# Patient Record
Sex: Female | Born: 1955 | Race: White | Hispanic: No | Marital: Single | Smoking: Current every day smoker
Health system: Southern US, Community
[De-identification: ages and names within clinical notes are randomized; demographics above are authoritative.]

## PROBLEM LIST (undated history)

## (undated) DIAGNOSIS — R569 Unspecified convulsions: Secondary | ICD-10-CM

## (undated) DIAGNOSIS — J449 Chronic obstructive pulmonary disease, unspecified: Secondary | ICD-10-CM

## (undated) DIAGNOSIS — F419 Anxiety disorder, unspecified: Secondary | ICD-10-CM

## (undated) DIAGNOSIS — F4310 Post-traumatic stress disorder, unspecified: Secondary | ICD-10-CM

## (undated) DIAGNOSIS — E876 Hypokalemia: Secondary | ICD-10-CM

## (undated) HISTORY — PX: NECK SURGERY: SHX720

## (undated) HISTORY — PX: BACK SURGERY: SHX140

---

## 2002-10-19 ENCOUNTER — Ambulatory Visit (HOSPITAL_COMMUNITY): Admission: RE | Admit: 2002-10-19 | Discharge: 2002-10-19 | Payer: Self-pay | Admitting: Neurosurgery

## 2002-10-19 ENCOUNTER — Encounter: Payer: Self-pay | Admitting: Neurosurgery

## 2002-11-16 ENCOUNTER — Ambulatory Visit (HOSPITAL_COMMUNITY): Admission: RE | Admit: 2002-11-16 | Discharge: 2002-11-17 | Payer: Self-pay | Admitting: Neurosurgery

## 2003-01-04 ENCOUNTER — Encounter: Payer: Self-pay | Admitting: Neurosurgery

## 2003-01-04 ENCOUNTER — Inpatient Hospital Stay (HOSPITAL_COMMUNITY): Admission: AD | Admit: 2003-01-04 | Discharge: 2003-01-08 | Payer: Self-pay | Admitting: Neurosurgery

## 2006-01-30 ENCOUNTER — Encounter: Admission: RE | Admit: 2006-01-30 | Discharge: 2006-01-30 | Payer: Self-pay | Admitting: Family Medicine

## 2015-11-13 ENCOUNTER — Inpatient Hospital Stay (HOSPITAL_COMMUNITY)
Admission: EM | Admit: 2015-11-13 | Discharge: 2015-11-18 | DRG: 470 | Disposition: A | Discharge status: Home Health | Payer: Medicare Other | Attending: Orthopedic Surgery | Admitting: Orthopedic Surgery

## 2015-11-13 ENCOUNTER — Inpatient Hospital Stay (HOSPITAL_COMMUNITY): Payer: Medicare Other

## 2015-11-13 ENCOUNTER — Emergency Department (HOSPITAL_COMMUNITY): Payer: Medicare Other

## 2015-11-13 ENCOUNTER — Encounter (HOSPITAL_COMMUNITY): Payer: Self-pay | Admitting: Emergency Medicine

## 2015-11-13 DIAGNOSIS — G629 Polyneuropathy, unspecified: Secondary | ICD-10-CM | POA: Diagnosis not present

## 2015-11-13 DIAGNOSIS — S72011A Unspecified intracapsular fracture of right femur, initial encounter for closed fracture: Principal | ICD-10-CM | POA: Diagnosis present

## 2015-11-13 DIAGNOSIS — Z79899 Other long term (current) drug therapy: Secondary | ICD-10-CM | POA: Diagnosis not present

## 2015-11-13 DIAGNOSIS — Z01818 Encounter for other preprocedural examination: Secondary | ICD-10-CM

## 2015-11-13 DIAGNOSIS — Z09 Encounter for follow-up examination after completed treatment for conditions other than malignant neoplasm: Secondary | ICD-10-CM

## 2015-11-13 DIAGNOSIS — F329 Major depressive disorder, single episode, unspecified: Secondary | ICD-10-CM | POA: Diagnosis not present

## 2015-11-13 DIAGNOSIS — G4700 Insomnia, unspecified: Secondary | ICD-10-CM | POA: Diagnosis present

## 2015-11-13 DIAGNOSIS — R569 Unspecified convulsions: Secondary | ICD-10-CM

## 2015-11-13 DIAGNOSIS — M545 Low back pain: Secondary | ICD-10-CM | POA: Diagnosis not present

## 2015-11-13 DIAGNOSIS — W1830XA Fall on same level, unspecified, initial encounter: Secondary | ICD-10-CM | POA: Diagnosis present

## 2015-11-13 DIAGNOSIS — E871 Hypo-osmolality and hyponatremia: Secondary | ICD-10-CM | POA: Diagnosis present

## 2015-11-13 DIAGNOSIS — D62 Acute posthemorrhagic anemia: Secondary | ICD-10-CM | POA: Diagnosis not present

## 2015-11-13 DIAGNOSIS — Z79891 Long term (current) use of opiate analgesic: Secondary | ICD-10-CM

## 2015-11-13 DIAGNOSIS — G8929 Other chronic pain: Secondary | ICD-10-CM | POA: Diagnosis present

## 2015-11-13 DIAGNOSIS — F17210 Nicotine dependence, cigarettes, uncomplicated: Secondary | ICD-10-CM | POA: Diagnosis not present

## 2015-11-13 DIAGNOSIS — F419 Anxiety disorder, unspecified: Secondary | ICD-10-CM | POA: Diagnosis not present

## 2015-11-13 DIAGNOSIS — W19XXXA Unspecified fall, initial encounter: Secondary | ICD-10-CM

## 2015-11-13 DIAGNOSIS — F4310 Post-traumatic stress disorder, unspecified: Secondary | ICD-10-CM | POA: Diagnosis present

## 2015-11-13 DIAGNOSIS — E876 Hypokalemia: Secondary | ICD-10-CM | POA: Diagnosis not present

## 2015-11-13 DIAGNOSIS — S72001A Fracture of unspecified part of neck of right femur, initial encounter for closed fracture: Secondary | ICD-10-CM | POA: Diagnosis not present

## 2015-11-13 DIAGNOSIS — F17200 Nicotine dependence, unspecified, uncomplicated: Secondary | ICD-10-CM

## 2015-11-13 DIAGNOSIS — Z419 Encounter for procedure for purposes other than remedying health state, unspecified: Secondary | ICD-10-CM

## 2015-11-13 DIAGNOSIS — M25551 Pain in right hip: Secondary | ICD-10-CM | POA: Diagnosis present

## 2015-11-13 HISTORY — DX: Post-traumatic stress disorder, unspecified: F43.10

## 2015-11-13 HISTORY — DX: Unspecified convulsions: R56.9

## 2015-11-13 HISTORY — DX: Anxiety disorder, unspecified: F41.9

## 2015-11-13 LAB — BASIC METABOLIC PANEL
ANION GAP: 8 (ref 5–15)
BUN: 8 mg/dL (ref 6–20)
CHLORIDE: 96 mmol/L — AB (ref 101–111)
CO2: 26 mmol/L (ref 22–32)
Calcium: 8.7 mg/dL — ABNORMAL LOW (ref 8.9–10.3)
Creatinine, Ser: 0.66 mg/dL (ref 0.44–1.00)
GFR calc Af Amer: >60 mL/min (ref >60)
GLUCOSE: 113 mg/dL — AB (ref 65–99)
POTASSIUM: 3.4 mmol/L — AB (ref 3.5–5.1)
Sodium: 130 mmol/L — ABNORMAL LOW (ref 135–145)

## 2015-11-13 LAB — PHOSPHORUS: Phosphorus: 3.4 mg/dL (ref 2.5–4.6)

## 2015-11-13 LAB — CBC WITH DIFFERENTIAL/PLATELET
BASOS ABS: 0 10*3/uL (ref 0.0–0.1)
Basophils Relative: 0 %
Eosinophils Absolute: 0 10*3/uL (ref 0.0–0.7)
Eosinophils Relative: 0 %
HCT: 35.2 % — ABNORMAL LOW (ref 36.0–46.0)
HEMOGLOBIN: 12.1 g/dL (ref 12.0–15.0)
LYMPHS ABS: 0.8 10*3/uL (ref 0.7–4.0)
LYMPHS PCT: 13 %
MCH: 32 pg (ref 26.0–34.0)
MCHC: 34.4 g/dL (ref 30.0–36.0)
MCV: 93.1 fL (ref 78.0–100.0)
Monocytes Absolute: 0.7 10*3/uL (ref 0.1–1.0)
Monocytes Relative: 11 %
NEUTROS ABS: 4.6 10*3/uL (ref 1.7–7.7)
NEUTROS PCT: 76 %
PLATELETS: 272 10*3/uL (ref 150–400)
RBC: 3.78 MIL/uL — AB (ref 3.87–5.11)
RDW: 13 % (ref 11.5–15.5)
WBC: 6 10*3/uL (ref 4.0–10.5)

## 2015-11-13 LAB — PROTIME-INR
INR: 1.03
PROTHROMBIN TIME: 13.5 s (ref 11.4–15.2)

## 2015-11-13 LAB — APTT: APTT: 31 s (ref 24–36)

## 2015-11-13 LAB — MRSA PCR SCREENING: MRSA by PCR: NEGATIVE

## 2015-11-13 LAB — MAGNESIUM: MAGNESIUM: 2.1 mg/dL (ref 1.7–2.4)

## 2015-11-13 LAB — GLUCOSE, CAPILLARY
GLUCOSE-CAPILLARY: 104 mg/dL — AB (ref 65–99)
GLUCOSE-CAPILLARY: 104 mg/dL — AB (ref 65–99)

## 2015-11-13 MED ORDER — CEFAZOLIN SODIUM-DEXTROSE 2-4 GM/100ML-% IV SOLN: 2.0000 g | INTRAVENOUS | Status: DC

## 2015-11-13 MED ORDER — GABAPENTIN 300 MG PO CAPS
300.0000 mg | ORAL_CAPSULE | Freq: Three times a day (TID) | ORAL | Status: DC
  Administered 2015-11-13 – 2015-11-18 (×13): 300 mg via ORAL
  Filled 2015-11-13 (×13): qty 1

## 2015-11-13 MED ORDER — DEXTROSE 5 % IV SOLN
3.0000 g | INTRAVENOUS | Status: DC
  Filled 2015-11-13: qty 3000

## 2015-11-13 MED ORDER — SODIUM CHLORIDE 0.9 % IV SOLN
INTRAVENOUS | Status: DC
  Administered 2015-11-14: 06:00:00 via INTRAVENOUS

## 2015-11-13 MED ORDER — METHOCARBAMOL 1000 MG/10ML IJ SOLN
1000.0000 mg | Freq: Four times a day (QID) | INTRAVENOUS | Status: DC | PRN
  Filled 2015-11-13: qty 10

## 2015-11-13 MED ORDER — CEFAZOLIN SODIUM 10 G IJ SOLR
3.0000 g | INTRAMUSCULAR | Status: DC
  Filled 2015-11-13: qty 3000

## 2015-11-13 MED ORDER — ONDANSETRON HCL 4 MG PO TABS: 4.0000 mg | ORAL_TABLET | Freq: Four times a day (QID) | ORAL | Status: DC | PRN

## 2015-11-13 MED ORDER — TRANEXAMIC ACID 1000 MG/10ML IV SOLN
1000.0000 mg | INTRAVENOUS | Status: AC
  Filled 2015-11-13: qty 10

## 2015-11-13 MED ORDER — SODIUM CHLORIDE 0.9 % IV SOLN
INTRAVENOUS | Status: DC
  Administered 2015-11-13: 18:00:00 via INTRAVENOUS

## 2015-11-13 MED ORDER — ACETAMINOPHEN 500 MG PO TABS
1000.0000 mg | ORAL_TABLET | Freq: Four times a day (QID) | ORAL | Status: DC
  Administered 2015-11-14 (×4): 1000 mg via ORAL
  Filled 2015-11-13 (×4): qty 2

## 2015-11-13 MED ORDER — MORPHINE SULFATE (PF) 2 MG/ML IV SOLN
2.0000 mg | INTRAVENOUS | Status: DC | PRN
  Administered 2015-11-13: 2 mg via INTRAVENOUS
  Filled 2015-11-13: qty 1

## 2015-11-13 MED ORDER — QUETIAPINE FUMARATE 100 MG PO TABS
100.0000 mg | ORAL_TABLET | Freq: Two times a day (BID) | ORAL | Status: DC
  Administered 2015-11-13 – 2015-11-18 (×9): 100 mg via ORAL
  Filled 2015-11-13 (×11): qty 1

## 2015-11-13 MED ORDER — ACETAMINOPHEN 10 MG/ML IV SOLN
1000.0000 mg | INTRAVENOUS | Status: AC
Start: 2015-11-13 — End: 2015-11-13
  Administered 2015-11-13: 1000 mg via INTRAVENOUS
  Filled 2015-11-13: qty 100

## 2015-11-13 MED ORDER — QUETIAPINE FUMARATE 300 MG PO TABS
300.0000 mg | ORAL_TABLET | Freq: Two times a day (BID) | ORAL | Status: DC
  Administered 2015-11-13 – 2015-11-18 (×10): 300 mg via ORAL
  Filled 2015-11-13 (×11): qty 1

## 2015-11-13 MED ORDER — CHLORHEXIDINE GLUCONATE 4 % EX LIQD
60.0000 mL | Freq: Once | CUTANEOUS | Status: AC
  Administered 2015-11-13: 4 via TOPICAL
  Filled 2015-11-13: qty 15

## 2015-11-13 MED ORDER — POVIDONE-IODINE 10 % EX SWAB: 2.0000 "application " | Freq: Once | CUTANEOUS | Status: DC

## 2015-11-13 MED ORDER — CHLORHEXIDINE GLUCONATE 4 % EX LIQD
60.0000 mL | Freq: Once | CUTANEOUS | Status: AC
  Administered 2015-11-13: 4 via TOPICAL

## 2015-11-13 MED ORDER — ALPRAZOLAM 0.5 MG PO TABS
1.0000 mg | ORAL_TABLET | Freq: Three times a day (TID) | ORAL | Status: DC
  Administered 2015-11-13 – 2015-11-18 (×13): 1 mg via ORAL
  Filled 2015-11-13 (×13): qty 2

## 2015-11-13 MED ORDER — AMPHETAMINE-DEXTROAMPHETAMINE 10 MG PO TABS
20.0000 mg | ORAL_TABLET | Freq: Every day | ORAL | Status: DC
  Administered 2015-11-14 – 2015-11-18 (×3): 20 mg via ORAL
  Filled 2015-11-13 (×4): qty 2

## 2015-11-13 MED ORDER — MORPHINE SULFATE (PF) 4 MG/ML IV SOLN
4.0000 mg | Freq: Once | INTRAVENOUS | Status: AC
Start: 2015-11-13 — End: 2015-11-13
  Administered 2015-11-13: 4 mg via INTRAVENOUS
  Filled 2015-11-13: qty 1

## 2015-11-13 MED ORDER — FENTANYL CITRATE (PF) 100 MCG/2ML IJ SOLN: 25.0000 ug | INTRAMUSCULAR | Status: DC | PRN

## 2015-11-13 MED ORDER — ONDANSETRON HCL 4 MG/2ML IJ SOLN: 4.0000 mg | Freq: Four times a day (QID) | INTRAMUSCULAR | Status: DC | PRN

## 2015-11-13 MED ORDER — OXYCODONE HCL 5 MG PO TABS
5.0000 mg | ORAL_TABLET | ORAL | Status: DC | PRN
  Administered 2015-11-14 (×4): 10 mg via ORAL
  Filled 2015-11-13 (×4): qty 2

## 2015-11-13 MED ORDER — TRAZODONE HCL 100 MG PO TABS
300.0000 mg | ORAL_TABLET | Freq: Every day | ORAL | Status: DC
  Administered 2015-11-13 – 2015-11-17 (×5): 300 mg via ORAL
  Filled 2015-11-13 (×5): qty 3

## 2015-11-13 MED ORDER — SODIUM CHLORIDE 0.9 % IV SOLN
1000.0000 mg | INTRAVENOUS | Status: AC
  Administered 2015-11-15: 1000 mg via INTRAVENOUS
  Filled 2015-11-13 (×2): qty 10

## 2015-11-13 MED ORDER — TIZANIDINE HCL 4 MG PO TABS: 4.0000 mg | ORAL_TABLET | Freq: Four times a day (QID) | ORAL | Status: DC | PRN

## 2015-11-13 MED ORDER — DEXTROSE 5 % IV SOLN: 3.0000 g | INTRAVENOUS | Status: DC

## 2015-11-13 MED ORDER — FENTANYL CITRATE (PF) 100 MCG/2ML IJ SOLN
50.0000 ug | Freq: Once | INTRAMUSCULAR | Status: AC
  Administered 2015-11-13: 50 ug via INTRAVENOUS
  Filled 2015-11-13: qty 2

## 2015-11-13 MED ORDER — LORAZEPAM 2 MG/ML IJ SOLN
1.0000 mg | Freq: Once | INTRAMUSCULAR | Status: AC | PRN
  Administered 2015-11-13: 1 mg via INTRAVENOUS
  Filled 2015-11-13: qty 1

## 2015-11-13 MED ORDER — NICOTINE 14 MG/24HR TD PT24
14.0000 mg | MEDICATED_PATCH | Freq: Every day | TRANSDERMAL | Status: DC
  Administered 2015-11-14: 14 mg via TRANSDERMAL
  Filled 2015-11-13: qty 1

## 2015-11-13 NOTE — ED Notes (Signed)
Pt to xray

## 2015-11-13 NOTE — H&P (Signed)
History and Physical    ARIES KASA LKG:[id number] DOB: January 30, 1955 DOA: 11/13/2015  PCP: No primary care provider on file. Patient coming from: home  Chief Complaint: R hip pain  HPI: MARCHA LICKLIDER is a 60 y.o. female with medical history significant of anxiety, PTSD, Szrs presenting w/ 3 day history of right hip pain. Fall sustained after left knee gave out. 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 Patient's right hip struck the floor. Immediately painful. Denies any head trauma, LOC, neck stiffness, dizziness, vertigo, chest pain, palpitation, shortness of breath, lower extremity swelling. Pain is constant and worse with certain movements. Induration is extremely difficult as weightbearing is very painful. Hydrocodone with minimal relief.   ED Course: Objective findings outlined below. Or the consult and for surgical correction.  Review of Systems: As per HPI otherwise 10 point review of systems negative.   Ambulatory Status: Prior to event no restrictions  Past Medical History:  Diagnosis Date  . Anxiety   . PTSD (post-traumatic stress disorder)   . Seizure in infant [facility])    suspect febrile seizure. Pt unsure of history.     Past Surgical History:  Procedure Laterality Date  . BACK SURGERY    . CESAREAN SECTION    . NECK SURGERY      Social History   Social History  . Marital status: Single    Spouse name: N/A  . Number of children: N/A  . Years of education: N/A   Occupational History  . Not on file.   Social History Main Topics  . Smoking status: Current Every Day Smoker    Packs/day: 1.00    Types: Cigarettes  . Smokeless tobacco: Never Used  . Alcohol use No  . Drug use: No  . Sexual activity: Not on file   Other Topics Concern  . Not on file   Social History Narrative  . No narrative on file    Allergies  Allergen Reactions  . Nsaids     "stomach problems"    History reviewed. No pertinent family  history.  Prior to Admission medications   Medication Sig Start Date End Date Taking? Authorizing Provider  albuterol (PROVENTIL HFA;VENTOLIN HFA) 108 (90 Base) MCG/ACT inhaler Inhale 1-2 puffs into the lungs every 6 (six) hours as needed for wheezing or shortness of breath.   Yes Historical Provider, MD  ALPRAZolam Duanne Moron) 1 MG tablet Take 1 mg by mouth 3 (three) times daily.   Yes Historical Provider, MD  amphetamine-dextroamphetamine (ADDERALL) 20 MG tablet Take 20 mg by mouth daily.   Yes Historical Provider, MD  gabapentin (NEURONTIN) 300 MG capsule Take 300 mg by mouth 3 (three) times daily.   Yes Historical Provider, MD  HYDROcodone-acetaminophen (NORCO) 10-325 MG tablet Take 1-2 tablets by mouth every 6 (six) hours as needed for moderate pain.   Yes Historical Provider, MD  QUEtiapine (SEROQUEL) 100 MG tablet Take 100 mg by mouth 2 (two) times daily.   Yes Historical Provider, MD  QUEtiapine (SEROQUEL) 300 MG tablet Take 300 mg by mouth 2 (two) times daily.   Yes Historical Provider, MD  tiZANidine (ZANAFLEX) 4 MG tablet Take 4 mg by mouth every 6 (six) hours as needed for muscle spasms.   Yes Historical Provider, MD  traZODone (DESYREL) 150 MG tablet Take 300 mg by mouth at bedtime.   Yes Historical Provider, MD    Physical Exam: Vitals:   11/13/15 1445 11/13/15 1515 11/13/15 1530 11/13/15 1600  BP: 102/74 124/86 119/82 115/82  Pulse: 81 73 70 74  Resp: 18  18   Temp:      TempSrc:      SpO2: 96% 95% 96% 96%     General:  Appears calm and comfortable Eyes:  PERRL, EOMI, normal lids, iris ENT:  grossly normal hearing, lips & tongue, mmm Neck:  no LAD, masses or thyromegaly Cardiovascular:  RRR, II/VI systolic murmur. No LE edema.  Respiratory:  CTA bilaterally, no w/r/r. Normal respiratory effort. Abdomen:  soft, ntnd, NABS Skin:  no rash or induration seen on limited exam Musculoskeletal:  grossly normal tone BUE/BLE, good ROM, no bony abnormality Psychiatric:  grossly  normal mood and affect, speech fluent and appropriate, AOx3 Neurologic:  CN 2-12 grossly intact, moves all extremities in coordinated fashion, sensation intact  Labs on Admission: I have personally reviewed following labs and imaging studies  CBC:  Recent Labs Lab 11/13/15 1336  WBC 6.0  NEUTROABS 4.6  HGB 12.1  HCT 35.2*  MCV 93.1  PLT 329   Basic Metabolic Panel:  Recent Labs Lab 11/13/15 1336  NA 130*  K 3.4*  CL 96*  CO2 26  GLUCOSE 113*  BUN 8  CREATININE 0.66  CALCIUM 8.7*   GFR: CrCl cannot be calculated (Unknown ideal weight.). Liver Function Tests: No results for input(s): AST, ALT, ALKPHOS, BILITOT, PROT, ALBUMIN in the last 168 hours. No results for input(s): LIPASE, AMYLASE in the last 168 hours. No results for input(s): AMMONIA in the last 168 hours. Coagulation Profile: No results for input(s): INR, PROTIME in the last 168 hours. Cardiac Enzymes: No results for input(s): CKTOTAL, CKMB, CKMBINDEX, TROPONINI in the last 168 hours. BNP (last 3 results) No results for input(s): PROBNP in the last 8760 hours. HbA1C: No results for input(s): HGBA1C in the last 72 hours. CBG: No results for input(s): GLUCAP in the last 168 hours. Lipid Profile: No results for input(s): CHOL, HDL, LDLCALC, TRIG, CHOLHDL, LDLDIRECT in the last 72 hours. Thyroid Function Tests: No results for input(s): TSH, T4TOTAL, FREET4, T3FREE, THYROIDAB in the last 72 hours. Anemia Panel: No results for input(s): VITAMINB12, FOLATE, FERRITIN, TIBC, IRON, RETICCTPCT in the last 72 hours. Urine analysis: No results found for: COLORURINE, APPEARANCEUR, LABSPEC, PHURINE, GLUCOSEU, HGBUR, BILIRUBINUR, KETONESUR, PROTEINUR, UROBILINOGEN, NITRITE, LEUKOCYTESUR  Creatinine Clearance: CrCl cannot be calculated (Unknown ideal weight.).  Sepsis Labs: '@LABRCNTIP'$ (procalcitonin:4,lacticidven:4) )No results found for this or any previous visit (from the past 240 hour(s)).   Radiological Exams  on Admission: Dg Hip Unilat  With Pelvis 2-3 Views Right  Result Date: 11/13/2015 CLINICAL DATA:  Status post fall 11/09/2012 with onset of right hip pain. Initial encounter. EXAM: DG HIP (WITH OR WITHOUT PELVIS) 2-3V RIGHT COMPARISON:  None. FINDINGS: There is a subcapital right hip fracture. The fracture is age indeterminate. There appears to be osteolysis of the inferior portion of the femoral head and proximal femoral neck. No dislocation. No other bony or joint abnormality. IMPRESSION: Subcapital right hip fracture cannot be definitively characterized but appears subacute to remote. Electronically Signed   By: Inge Rise M.D.   On: 11/13/2015 13:26   Dg Femur Min 2 Views Right  Result Date: 11/13/2015 CLINICAL DATA:  Right upper leg pain since a fall 11/10/2015. Initial encounter. EXAM: RIGHT FEMUR 2 VIEWS COMPARISON:  Plain films right hip this same day. FINDINGS: Right femoral neck fracture is identified. No other bony or joint abnormality is seen. IMPRESSION: Right femoral neck fracture as seen on the  comparison examination. The study is otherwise negative. Electronically Signed   By: Inge Rise M.D.   On: 11/13/2015 13:27    EKG: pending  Assessment/Plan Active Problems:   Closed right hip fracture (HCC)   Seizures (HCC)   PTSD (post-traumatic stress disorder)   Anxiety   Tobacco use disorder   Closed right hip fracture: As noted on radiology report as above. Neurovascularly intact. Orthopedic surgery, Dr. Stann Mainland, consulted and planning to evaluate patient for possible surgical correction versus conservative management. - Further management per order to - We'll continue to have patient nothing by mouth until evaluated - Preop EKG, chest x-ray, coags ordered  Hyponatremia: 130. Mild - NS IV - BMET in am  Insomnia/Depression/PTSD/Anxiety: takes xanax '1mg'$  TID - IV ativan until able to take PO - Continue Seroquel, Trazodone  Peripheral neuropathy and chronic MSK  pain: - Continue neurontin when taking PO - On Norco at baseline. - Continue zanaflex when able.  Tobacco: - nicotine patch  Infant Seizure: likely febrile seizure as small child. Not on any controller medications.  - Monitor  DVT prophylaxis: SCD  Code Status: full  Family Communication: none  Disposition Plan: pending ortho eval and   Consults called: ortho  Admission status: inpt    MERRELL, DAVID J MD [facility]  If 7PM-7AM, please contact night-coverage [email or web address] Password [email or web address]  11/13/2015, 5:42 PM

## 2015-11-13 NOTE — ED Triage Notes (Signed)
Pt fell on Sunday and has been having pain in her right hip/thigh since. Pt unable to bear weight on right leg. Pt's right foot externally rotated. Pt's family concerned for hip fx

## 2015-11-13 NOTE — ED Notes (Signed)
Pt brought back to room with daughter. Pt not able to put any weight on rt side. Pt complains of 10/10 pain in her rt leg after a fall 3 days ago. Pt placed on monitor and given warm blanket.

## 2015-11-13 NOTE — ED Notes (Signed)
Pt returned to room  

## 2015-11-13 NOTE — ED Notes (Signed)
Pt placed on fracture pan. Advised pt to use the call bell when finished.

## 2015-11-13 NOTE — ED Provider Notes (Signed)
[facility] DEPT Provider Note   CSN: [id number] Arrival date & time: 11/13/15  1140     History   Chief Complaint Chief Complaint  Patient presents with  . Hip Pain  . Fall    HPI Jennifer Stevens is a 60 y.o. female.  HPI   Patient is a 60 year old female with history of seizures, PTSD and anxiety who presents the ED with complaint of right hip pain, onset 3 days. Patient reports having chronic left knee pain for the past 6 months which she notes all intermittently causes her left knee give out on her. She reports while she was walking 3 nights ago her left knee gave out on her resulting on her falling to the floor. Denies head injury or LOC. Patient reports having immediate pain to her right hip and upper thigh after the fall. She notes she has been unable to bear weight or bend her left hip since the fall. Patient reports she has been taking her home prescription of hydrocodone for her chronic back pain without relief of her hip pain. Denies fever, chills, chest pain, shortness of breath, abdominal pain, vomiting, diarrhea, urinary symptoms, numbness, tingling, swelling.  PCP- Dr. [name] ([place])  Past Medical History:  Diagnosis Date  . Anxiety   . PTSD (post-traumatic stress disorder)   . Seizures [facility])     Patient Active Problem List   Diagnosis Date Noted  . Closed right hip fracture ([place]) 11/13/2015    Past Surgical History:  Procedure Laterality Date  . BACK SURGERY    . CESAREAN SECTION    . NECK SURGERY      OB History    No data available       Home Medications    Prior to Admission medications   Medication Sig Start Date End Date Taking? Authorizing Provider  albuterol (PROVENTIL HFA;VENTOLIN HFA) 108 (90 Base) MCG/ACT inhaler Inhale 1-2 puffs into the lungs every 6 (six) hours as needed for wheezing or shortness of breath.   Yes Historical Provider, MD  ALPRAZolam Duanne Moron) 1 MG tablet Take 1 mg by mouth 3 (three) times  daily.   Yes Historical Provider, MD  amphetamine-dextroamphetamine (ADDERALL) 20 MG tablet Take 20 mg by mouth daily.   Yes Historical Provider, MD  gabapentin (NEURONTIN) 300 MG capsule Take 300 mg by mouth 3 (three) times daily.   Yes Historical Provider, MD  HYDROcodone-acetaminophen (NORCO) 10-325 MG tablet Take 1-2 tablets by mouth every 6 (six) hours as needed for moderate pain.   Yes Historical Provider, MD  QUEtiapine (SEROQUEL) 100 MG tablet Take 100 mg by mouth 2 (two) times daily.   Yes Historical Provider, MD  QUEtiapine (SEROQUEL) 300 MG tablet Take 300 mg by mouth 2 (two) times daily.   Yes Historical Provider, MD  tiZANidine (ZANAFLEX) 4 MG tablet Take 4 mg by mouth every 6 (six) hours as needed for muscle spasms.   Yes Historical Provider, MD  traZODone (DESYREL) 150 MG tablet Take 300 mg by mouth at bedtime.   Yes Historical Provider, MD    Family History History reviewed. No pertinent family history.  Social History Social History  Substance Use Topics  . Smoking status: Current Every Day Smoker    Packs/day: 1.00    Types: Cigarettes  . Smokeless tobacco: Never Used  . Alcohol use No     Allergies   Nsaids   Review of Systems Review of Systems  Musculoskeletal: Positive for arthralgias (left hip) and myalgias (left  thigh).  All other systems reviewed and are negative.    Physical Exam Updated Vital Signs BP 124/86   Pulse 73   Temp 97.7 F (36.5 C) (Oral)   Resp 18   SpO2 95%   Physical Exam  Constitutional: She is oriented to person, place, and time. She appears well-developed and well-nourished.  HENT:  Head: Normocephalic and atraumatic. Head is without raccoon's eyes, without Battle's sign, without abrasion and without laceration.  Right Ear: Tympanic membrane normal. No hemotympanum.  Left Ear: Tympanic membrane normal. No hemotympanum.  Nose: Nose normal. No sinus tenderness, nasal deformity, septal deviation or nasal septal hematoma. No  epistaxis.  Mouth/Throat: Uvula is midline, oropharynx is clear and moist and mucous membranes are normal. No oropharyngeal exudate, posterior oropharyngeal edema, posterior oropharyngeal erythema or tonsillar abscesses.  Eyes: Conjunctivae and EOM are normal. Pupils are equal, round, and reactive to light. Right eye exhibits no discharge. Left eye exhibits no discharge. No scleral icterus.  Neck: Normal range of motion. Neck supple.  Cardiovascular: Normal rate, regular rhythm, normal heart sounds and intact distal pulses.   Pulmonary/Chest: Effort normal and breath sounds normal. No respiratory distress. She has no wheezes. She has no rales. She exhibits no tenderness.  Abdominal: Soft. Bowel sounds are normal. She exhibits no distension and no mass. There is no tenderness. There is no rebound and no guarding. No hernia.  Musculoskeletal: She exhibits tenderness. She exhibits no edema or deformity.       Right hip: She exhibits decreased range of motion, decreased strength and tenderness. She exhibits no swelling, no crepitus, no deformity and no laceration.       Right knee: She exhibits decreased range of motion (due to hip pain). She exhibits no swelling, no effusion, no ecchymosis, no deformity, no laceration and no erythema. No tenderness found.       Right upper leg: She exhibits tenderness. She exhibits no swelling, no edema, no deformity and no laceration.       Legs: No cervical, thoracic, or lumbar spine midline TTP.  Full ROM of bilateral upper and left lower extremities with 5/5 strength.  Decreased ROM and strength of right leg due to severe pain reported to right hip. 2+ radial and DP pulses. Sensation grossly intact.   Neurological: She is alert and oriented to person, place, and time. She has normal strength. No cranial nerve deficit or sensory deficit. Coordination and gait normal.  Skin: Skin is warm and dry. Capillary refill takes less than 2 seconds.  Nursing note and vitals  reviewed.    ED Treatments / Results  Labs (all labs ordered are listed, but only abnormal results are displayed) Labs Reviewed  CBC WITH DIFFERENTIAL/PLATELET - Abnormal; Notable for the following:       Result Value   RBC 3.78 (*)    HCT 35.2 (*)    All other components within normal limits  BASIC METABOLIC PANEL - Abnormal; Notable for the following:    Sodium 130 (*)    Potassium 3.4 (*)    Chloride 96 (*)    Glucose, Bld 113 (*)    Calcium 8.7 (*)    All other components within normal limits    EKG  EKG Interpretation None       Radiology Dg Hip Unilat  With Pelvis 2-3 Views Right  Result Date: 11/13/2015 CLINICAL DATA:  Status post fall 11/09/2012 with onset of right hip pain. Initial encounter. EXAM: DG HIP (WITH OR WITHOUT PELVIS) 2-3V  RIGHT COMPARISON:  None. FINDINGS: There is a subcapital right hip fracture. The fracture is age indeterminate. There appears to be osteolysis of the inferior portion of the femoral head and proximal femoral neck. No dislocation. No other bony or joint abnormality. IMPRESSION: Subcapital right hip fracture cannot be definitively characterized but appears subacute to remote. Electronically Signed   By: Inge Rise M.D.   On: 11/13/2015 13:26   Dg Femur Min 2 Views Right  Result Date: 11/13/2015 CLINICAL DATA:  Right upper leg pain since a fall 11/10/2015. Initial encounter. EXAM: RIGHT FEMUR 2 VIEWS COMPARISON:  Plain films right hip this same day. FINDINGS: Right femoral neck fracture is identified. No other bony or joint abnormality is seen. IMPRESSION: Right femoral neck fracture as seen on the comparison examination. The study is otherwise negative. Electronically Signed   By: Inge Rise M.D.   On: 11/13/2015 13:27    Procedures Procedures (including critical care time)  Medications Ordered in ED Medications  fentaNYL (SUBLIMAZE) injection 50 mcg (50 mcg Intravenous Given 11/13/15 1257)  morphine 4 MG/ML injection 4 mg  (4 mg Intravenous Given 11/13/15 1418)     Initial Impression / Assessment and Plan / ED Course  I have reviewed the triage vital signs and the nursing notes.  Pertinent labs & imaging results that were available during my care of the patient were reviewed by me and considered in my medical decision making (see chart for details).  Clinical Course     Patient presents with right hip pain that occurred after falling due to her left knee giving out on her 3 days ago. Denies head injury or LOC. She notes decreased range of motion and being unable to bear weight on her left leg due to severe pain. VSS. Exam revealed tenderness to palpation of anterior proximal thigh with decreased range of motion of right hip due to reported pain. Right lower extremity otherwise vascularly intact. No other signs of head injury or trauma. X-ray revealed right subcapital hip fracture. Consulted orthopedics, Dr. Corine Shelter advised to admit patient to medicine and states he will consult during her admission, advised to keep pt NPO. Consulted hospitalist. Dr. Marily Memos agrees to admission. Orders placed for inpatient MedSurg bed. Discussed results and plan for admission with patient.  Final Clinical Impressions(s) / ED Diagnoses   Final diagnoses:  Fall, initial encounter  Closed fracture of right hip, initial encounter [facility])    New Prescriptions New Prescriptions   No medications on file     Nona Dell, PA-C 11/13/15 [place], MD 11/14/15 [phone]

## 2015-11-13 NOTE — Progress Notes (Signed)
Pt arrived alert and orientated x4, suction set up in room due to history of seizures

## 2015-11-13 NOTE — Progress Notes (Signed)
Due to scheduling conflicts the hip surgery has been moved to Friday morning.  Diet order placed, NPO order placed for Friday am at 0001 am.  Dr. Lyla Glassing to perform Right THA Friday am.  Nicholes Stairs

## 2015-11-13 NOTE — Consult Note (Signed)
ORTHOPAEDIC CONSULTATION  REQUESTING PHYSICIAN: Waldemar Dickens, MD  PCP:  No primary care provider on file.  Chief Complaint: Right hip   HPI: Jennifer Stevens is a 60 y.o. female who complains of  Right hip pain following a fall on Sunday and difficulty weight bearing.  She presented to our ED with ER deformity and shortening.  Xrays confirmed right intracapsular fracture.  She is on permanent disability, with chrionic LBP and PTSD, but does ambulate without assistive devices at baseline.  Past Medical History:  Diagnosis Date  . Anxiety   . PTSD (post-traumatic stress disorder)   . Seizures ([place])    Past Surgical History:  Procedure Laterality Date  . BACK SURGERY    . CESAREAN SECTION    . NECK SURGERY     Social History   Social History  . Marital status: Single    Spouse name: N/A  . Number of children: N/A  . Years of education: N/A   Social History Main Topics  . Smoking status: Current Every Day Smoker    Packs/day: 1.00    Types: Cigarettes  . Smokeless tobacco: Never Used  . Alcohol use No  . Drug use: No  . Sexual activity: Not Asked   Other Topics Concern  . None   Social History Narrative  . None   History reviewed. No pertinent family history. Allergies  Allergen Reactions  . Nsaids     "stomach problems"   Prior to Admission medications   Medication Sig Start Date End Date Taking? Authorizing Provider  albuterol (PROVENTIL HFA;VENTOLIN HFA) 108 (90 Base) MCG/ACT inhaler Inhale 1-2 puffs into the lungs every 6 (six) hours as needed for wheezing or shortness of breath.   Yes Historical Provider, MD  ALPRAZolam Duanne Moron) 1 MG tablet Take 1 mg by mouth 3 (three) times daily.   Yes Historical Provider, MD  amphetamine-dextroamphetamine (ADDERALL) 20 MG tablet Take 20 mg by mouth daily.   Yes Historical Provider, MD  gabapentin (NEURONTIN) 300 MG capsule Take 300 mg by mouth 3 (three) times daily.   Yes Historical Provider, MD    HYDROcodone-acetaminophen (NORCO) 10-325 MG tablet Take 1-2 tablets by mouth every 6 (six) hours as needed for moderate pain.   Yes Historical Provider, MD  QUEtiapine (SEROQUEL) 100 MG tablet Take 100 mg by mouth 2 (two) times daily.   Yes Historical Provider, MD  QUEtiapine (SEROQUEL) 300 MG tablet Take 300 mg by mouth 2 (two) times daily.   Yes Historical Provider, MD  tiZANidine (ZANAFLEX) 4 MG tablet Take 4 mg by mouth every 6 (six) hours as needed for muscle spasms.   Yes Historical Provider, MD  traZODone (DESYREL) 150 MG tablet Take 300 mg by mouth at bedtime.   Yes Historical Provider, MD   Dg Hip Unilat  With Pelvis 2-3 Views Right  Result Date: 11/13/2015 CLINICAL DATA:  Status post fall 11/09/2012 with onset of right hip pain. Initial encounter. EXAM: DG HIP (WITH OR WITHOUT PELVIS) 2-3V RIGHT COMPARISON:  None. FINDINGS: There is a subcapital right hip fracture. The fracture is age indeterminate. There appears to be osteolysis of the inferior portion of the femoral head and proximal femoral neck. No dislocation. No other bony or joint abnormality. IMPRESSION: Subcapital right hip fracture cannot be definitively characterized but appears subacute to remote. Electronically Signed   By: Inge Rise M.D.   On: 11/13/2015 13:26   Dg Femur Min 2 Views Right  Result Date: 11/13/2015 CLINICAL DATA:  Right upper leg pain since a fall 11/10/2015. Initial encounter. EXAM: RIGHT FEMUR 2 VIEWS COMPARISON:  Plain films right hip this same day. FINDINGS: Right femoral neck fracture is identified. No other bony or joint abnormality is seen. IMPRESSION: Right femoral neck fracture as seen on the comparison examination. The study is otherwise negative. Electronically Signed   By: Inge Rise M.D.   On: 11/13/2015 13:27    Positive ROS: All other systems have been reviewed and were otherwise negative with the exception of those mentioned in the HPI and as above.  Physical Exam: General:  Alert, no acute distress Cardiovascular: No pedal edema Respiratory: No cyanosis, no use of accessory musculature GI: No organomegaly, abdomen is soft and non-tender Skin: No lesions in the area of chief complaint Neurologic: Sensation intact distally Psychiatric: Patient is competent for consent with normal mood and affect Lymphatic: No axillary or cervical lymphadenopathy  MUSCULOSKELETAL:  RLE- leg is shortened and ER +SILT sp/dp/sur/saph, 2+ DP , +TA/GSC/FHL/EHL  xrays-   EXAM: DG HIP (WITH OR WITHOUT PELVIS) 2-3V RIGHT  COMPARISON:  None.  FINDINGS: There is a subcapital right hip fracture. The fracture is age indeterminate. There appears to be osteolysis of the inferior portion of the femoral head and proximal femoral neck. No dislocation. No other bony or joint abnormality.  IMPRESSION: Subcapital right hip fracture cannot be definitively characterized but appears subacute to remote. Assessment: Right hip intracapsular fracture, requiring surgery.  Plan: -NWB -NPO -pain control -will plan for surgery tonight with total hip replacement -PT/OT post op    Nicholes Stairs, MD Cell [phone]    11/13/2015 5:05 PM

## 2015-11-14 DIAGNOSIS — S72001A Fracture of unspecified part of neck of right femur, initial encounter for closed fracture: Secondary | ICD-10-CM

## 2015-11-14 DIAGNOSIS — F419 Anxiety disorder, unspecified: Secondary | ICD-10-CM

## 2015-11-14 LAB — CBC
HCT: 37.5 % (ref 36.0–46.0)
HEMOGLOBIN: 12.6 g/dL (ref 12.0–15.0)
MCH: 31.5 pg (ref 26.0–34.0)
MCHC: 33.6 g/dL (ref 30.0–36.0)
MCV: 93.8 fL (ref 78.0–100.0)
PLATELETS: 329 10*3/uL (ref 150–400)
RBC: 4 MIL/uL (ref 3.87–5.11)
RDW: 13.1 % (ref 11.5–15.5)
WBC: 3.9 10*3/uL — AB (ref 4.0–10.5)

## 2015-11-14 LAB — BASIC METABOLIC PANEL
ANION GAP: 7 (ref 5–15)
BUN: 5 mg/dL — AB (ref 6–20)
CHLORIDE: 103 mmol/L (ref 101–111)
CO2: 23 mmol/L (ref 22–32)
Calcium: 8.4 mg/dL — ABNORMAL LOW (ref 8.9–10.3)
Creatinine, Ser: 0.6 mg/dL (ref 0.44–1.00)
GFR calc Af Amer: >60 mL/min (ref >60)
Glucose, Bld: 105 mg/dL — ABNORMAL HIGH (ref 65–99)
POTASSIUM: 3.5 mmol/L (ref 3.5–5.1)
SODIUM: 133 mmol/L — AB (ref 135–145)

## 2015-11-14 LAB — GLUCOSE, CAPILLARY: GLUCOSE-CAPILLARY: 112 mg/dL — AB (ref 65–99)

## 2015-11-14 MED ORDER — ENOXAPARIN SODIUM 30 MG/0.3ML ~~LOC~~ SOLN
30.0000 mg | Freq: Once | SUBCUTANEOUS | Status: AC
  Administered 2015-11-14: 30 mg via SUBCUTANEOUS
  Filled 2015-11-14: qty 0.3

## 2015-11-14 NOTE — Anesthesia Preprocedure Evaluation (Addendum)
Anesthesia Evaluation  Patient identified by MRN, date of birth, ID band Patient awake    Reviewed: Allergy & Precautions, H&P , NPO status , Patient's Chart, lab work & pertinent test results  Airway Mallampati: III  TM Distance: >3 FB Neck ROM: Full    Dental no notable dental hx. (+) Poor Dentition, Dental Advisory Given   Pulmonary Current Smoker,    Pulmonary exam normal breath sounds clear to auscultation       Cardiovascular negative cardio ROS   Rhythm:Regular Rate:Normal     Neuro/Psych Seizures -, Well Controlled,  Anxiety negative psych ROS   GI/Hepatic negative GI ROS, Neg liver ROS,   Endo/Other  negative endocrine ROS  Renal/GU negative Renal ROS  negative genitourinary   Musculoskeletal   Abdominal   Peds  Hematology negative hematology ROS (+)   Anesthesia Other Findings   Reproductive/Obstetrics negative OB ROS                            Anesthesia Physical Anesthesia Plan  ASA: II  Anesthesia Plan: General   Post-op Pain Management:    Induction: Intravenous  Airway Management Planned: Oral ETT  Additional Equipment:   Intra-op Plan:   Post-operative Plan: Extubation in OR  Informed Consent: I have reviewed the patients History and Physical, chart, labs and discussed the procedure including the risks, benefits and alternatives for the proposed anesthesia with the patient or authorized representative who has indicated his/her understanding and acceptance.   Dental advisory given  Plan Discussed with: CRNA  Anesthesia Plan Comments:         Anesthesia Quick Evaluation

## 2015-11-14 NOTE — Progress Notes (Signed)
Patient ID: Jennifer Stevens, female   DOB: 10-07-1955, 60 y.o.   MRN: [id number]    PROGRESS NOTE    Jennifer Stevens  HUT:[id number] DOB: 1955-12-21 DOA: 11/13/2015  PCP: No primary care provider on file.   Brief Narrative:  60 y.o. female with medical history significant of anxiety, PTSD, Szrs presenting w/ 3 day history of right hip pain. Fall sustained after left knee gave out. 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 Patient's right hip struck the floor. Immediately painful.   Assessment & Plan:  Right hip intracapsular fracture, requiring surgery. - still in pain, keep NPO after midnight  - plan to take to OR tomorrow  - appreciate ortho team following  - PT/OT eval post op    Seizures ([place]) - stable for now    Hypokalemia - supplemented and WNL this am   DVT prophylaxis: Lovenox SQ Code Status: Full Family Communication: Patient at bedside  Disposition Plan: To be determined post op  Consultants:   Ortho  Procedures:   None  Antimicrobials:   Pre op ABX   Subjective: Still with right hip pain.  Objective: Vitals:   11/13/15 1530 11/13/15 1600 11/13/15 1939 11/14/15 0355  BP: 119/82 115/82 120/80 (!) 114/91  Pulse: 70 74 72 73  Resp: '18  17 17  '$ Temp:   98.3 F (36.8 C) 98 F (36.7 C)  TempSrc:   Oral Oral  SpO2: 96% 96% 96% 92%    Intake/Output Summary (Last 24 hours) at 11/14/15 1342 Last data filed at 11/14/15 0556  Gross per 24 hour  Intake            892.5 ml  Output                0 ml  Net            892.5 ml   There were no vitals filed for this visit.  Examination:  General exam: Appears calm and comfortable  Respiratory system: Clear to auscultation. Respiratory effort normal. Cardiovascular system: S1 & S2 heard, RRR. No JVD, murmurs, rubs, gallops or clicks. No pedal edema. Gastrointestinal system: Abdomen is nondistended, soft and nontender. No organomegaly or masses felt. Normal bowel sounds  Central  nervous system: Alert and oriented. No focal neurological deficits.   Data Reviewed: I have personally reviewed following labs and imaging studies  CBC:  Recent Labs Lab 11/13/15 1336 11/14/15 0528  WBC 6.0 3.9*  NEUTROABS 4.6  --   HGB 12.1 12.6  HCT 35.2* 37.5  MCV 93.1 93.8  PLT 272 656   Basic Metabolic Panel:  Recent Labs Lab 11/13/15 1336 11/13/15 1845 11/14/15 0528  NA 130*  --  133*  K 3.4*  --  3.5  CL 96*  --  103  CO2 26  --  23  GLUCOSE 113*  --  105*  BUN 8  --  5*  CREATININE 0.66  --  0.60  CALCIUM 8.7*  --  8.4*  MG  --  2.1  --   PHOS  --  3.4  --    Coagulation Profile:  Recent Labs Lab 11/13/15 1845  INR 1.03   CBG:  Recent Labs Lab 11/13/15 1827 11/13/15 2105 11/14/15 0621  GLUCAP 104* 104* 112*   Recent Results (from the past 240 hour(s))  MRSA PCR Screening     Status: None   Collection Time: 11/13/15  6:00 PM  Result Value Ref Range  Status   MRSA by PCR NEGATIVE NEGATIVE Final    Comment:        The GeneXpert MRSA Assay (FDA approved for NASAL specimens only), is one component of a comprehensive MRSA colonization surveillance program. It is not intended to diagnose MRSA infection nor to guide or monitor treatment for MRSA infections.       Radiology Studies: Dg Chest Port 1 View  Result Date: 11/13/2015 CLINICAL DATA:  Preop total hip arthroplasty EXAM: PORTABLE CHEST 1 VIEW COMPARISON:  12/23/2009 FINDINGS: Lungs are clear.  No pleural effusion or pneumothorax. The heart is normal in size. IMPRESSION: No evidence of acute cardiopulmonary disease. Electronically Signed   By: Julian Hy M.D.   On: 11/13/2015 18:22   Dg Hip Unilat  With Pelvis 2-3 Views Right  Result Date: 11/13/2015 CLINICAL DATA:  Status post fall 11/09/2012 with onset of right hip pain. Initial encounter. EXAM: DG HIP (WITH OR WITHOUT PELVIS) 2-3V RIGHT COMPARISON:  None. FINDINGS: There is a subcapital right hip fracture. The fracture is age  indeterminate. There appears to be osteolysis of the inferior portion of the femoral head and proximal femoral neck. No dislocation. No other bony or joint abnormality. IMPRESSION: Subcapital right hip fracture cannot be definitively characterized but appears subacute to remote. Electronically Signed   By: Inge Rise M.D.   On: 11/13/2015 13:26   Dg Femur Min 2 Views Right  Result Date: 11/13/2015 CLINICAL DATA:  Right upper leg pain since a fall 11/10/2015. Initial encounter. EXAM: RIGHT FEMUR 2 VIEWS COMPARISON:  Plain films right hip this same day. FINDINGS: Right femoral neck fracture is identified. No other bony or joint abnormality is seen. IMPRESSION: Right femoral neck fracture as seen on the comparison examination. The study is otherwise negative. Electronically Signed   By: Inge Rise M.D.   On: 11/13/2015 13:27      Scheduled Meds: . acetaminophen  1,000 mg Oral Q6H  . ALPRAZolam  1 mg Oral TID  . amphetamine-dextroamphetamine  20 mg Oral Daily  . [START ON 11/15/2015]  ceFAZolin (ANCEF) IV  3 g Intravenous On Call to OR  . gabapentin  300 mg Oral TID  . nicotine  14 mg Transdermal Daily  . povidone-iodine  2 application Topical Once  . povidone-iodine  2 application Topical Once  . QUEtiapine  100 mg Oral BID  . QUEtiapine  300 mg Oral BID  . tranexamic acid  1,000 mg Intravenous To OR  . [START ON 11/15/2015] tranexamic acid  1,000 mg Intravenous To OR  . traZODone  300 mg Oral QHS   Continuous Infusions: . sodium chloride 75 mL/hr at 11/14/15 0556  . sodium chloride 75 mL/hr at 11/13/15 1802     LOS: 1 day    Time spent: 20 minutes    Faye Ramsay, MD [facility] Pager [phone]  If 7PM-7AM, please contact night-coverage [email or web address] Password [email or web address] 11/14/2015, 1:42 PM

## 2015-11-15 ENCOUNTER — Inpatient Hospital Stay (HOSPITAL_COMMUNITY): Payer: Medicare Other

## 2015-11-15 ENCOUNTER — Encounter (HOSPITAL_COMMUNITY)
Admission: EM | Disposition: A | Discharge status: Home Health | Payer: Self-pay | Source: Home / Self Care | Attending: Orthopedic Surgery

## 2015-11-15 ENCOUNTER — Inpatient Hospital Stay (HOSPITAL_COMMUNITY): Payer: Medicare Other | Admitting: Certified Registered Nurse Anesthetist

## 2015-11-15 ENCOUNTER — Encounter (HOSPITAL_COMMUNITY): Payer: Self-pay | Admitting: Certified Registered Nurse Anesthetist

## 2015-11-15 DIAGNOSIS — S72001A Fracture of unspecified part of neck of right femur, initial encounter for closed fracture: Secondary | ICD-10-CM | POA: Diagnosis present

## 2015-11-15 HISTORY — PX: TOTAL HIP ARTHROPLASTY: SHX124

## 2015-11-15 LAB — BASIC METABOLIC PANEL
ANION GAP: 7 (ref 5–15)
BUN: <5 mg/dL — ABNORMAL LOW (ref 6–20)
CALCIUM: 8.1 mg/dL — AB (ref 8.9–10.3)
CO2: 23 mmol/L (ref 22–32)
Chloride: 106 mmol/L (ref 101–111)
Creatinine, Ser: 0.5 mg/dL (ref 0.44–1.00)
Glucose, Bld: 90 mg/dL (ref 65–99)
Potassium: 3.2 mmol/L — ABNORMAL LOW (ref 3.5–5.1)
Sodium: 136 mmol/L (ref 135–145)

## 2015-11-15 LAB — CBC
HCT: 32.3 % — ABNORMAL LOW (ref 36.0–46.0)
Hemoglobin: 10.8 g/dL — ABNORMAL LOW (ref 12.0–15.0)
MCH: 31.4 pg (ref 26.0–34.0)
MCHC: 33.4 g/dL (ref 30.0–36.0)
MCV: 93.9 fL (ref 78.0–100.0)
PLATELETS: 314 10*3/uL (ref 150–400)
RBC: 3.44 MIL/uL — AB (ref 3.87–5.11)
RDW: 13.1 % (ref 11.5–15.5)
WBC: 4.5 10*3/uL (ref 4.0–10.5)

## 2015-11-15 SURGERY — ARTHROPLASTY, HIP, TOTAL, ANTERIOR APPROACH
Anesthesia: General | Laterality: Right

## 2015-11-15 MED ORDER — FENTANYL CITRATE (PF) 100 MCG/2ML IJ SOLN
INTRAMUSCULAR | Status: DC | PRN
  Administered 2015-11-15 (×6): 50 ug via INTRAVENOUS

## 2015-11-15 MED ORDER — BUPIVACAINE HCL (PF) 0.5 % IJ SOLN
INTRAMUSCULAR | Status: AC
  Filled 2015-11-15: qty 30

## 2015-11-15 MED ORDER — HYDROMORPHONE HCL 1 MG/ML IJ SOLN
0.2500 mg | INTRAMUSCULAR | Status: DC | PRN
  Administered 2015-11-15 (×4): 0.5 mg via INTRAVENOUS

## 2015-11-15 MED ORDER — ACETAMINOPHEN 325 MG PO TABS
650.0000 mg | ORAL_TABLET | Freq: Four times a day (QID) | ORAL | Status: DC | PRN
  Administered 2015-11-15 – 2015-11-17 (×3): 650 mg via ORAL
  Filled 2015-11-15 (×4): qty 2

## 2015-11-15 MED ORDER — SODIUM CHLORIDE 0.9 % IJ SOLN
INTRAMUSCULAR | Status: DC | PRN
  Administered 2015-11-15: 30 mL

## 2015-11-15 MED ORDER — ACETAMINOPHEN 10 MG/ML IV SOLN
INTRAVENOUS | Status: AC
  Filled 2015-11-15: qty 100

## 2015-11-15 MED ORDER — KETOROLAC TROMETHAMINE 30 MG/ML IJ SOLN
INTRAMUSCULAR | Status: DC | PRN
  Administered 2015-11-15: 30 mg via INTRAMUSCULAR

## 2015-11-15 MED ORDER — METOCLOPRAMIDE HCL 5 MG/ML IJ SOLN
INTRAMUSCULAR | Status: AC
  Filled 2015-11-15: qty 2

## 2015-11-15 MED ORDER — ROCURONIUM BROMIDE 10 MG/ML (PF) SYRINGE
PREFILLED_SYRINGE | INTRAVENOUS | Status: DC | PRN
  Administered 2015-11-15 (×3): 10 mg via INTRAVENOUS
  Administered 2015-11-15: 50 mg via INTRAVENOUS

## 2015-11-15 MED ORDER — MORPHINE SULFATE (PF) 2 MG/ML IV SOLN: 0.5000 mg | INTRAVENOUS | Status: DC | PRN

## 2015-11-15 MED ORDER — SODIUM CHLORIDE 0.9 % IR SOLN
Status: DC | PRN
  Administered 2015-11-15: 3000 mL

## 2015-11-15 MED ORDER — APIXABAN 2.5 MG PO TABS: 2.5000 mg | ORAL_TABLET | Freq: Two times a day (BID) | ORAL | 0 refills | Status: DC

## 2015-11-15 MED ORDER — BUPIVACAINE HCL (PF) 0.5 % IJ SOLN
INTRAMUSCULAR | Status: DC | PRN
  Administered 2015-11-15: 30 mL

## 2015-11-15 MED ORDER — SENNA 8.6 MG PO TABS
1.0000 | ORAL_TABLET | Freq: Two times a day (BID) | ORAL | Status: DC
  Administered 2015-11-15 – 2015-11-18 (×7): 8.6 mg via ORAL
  Filled 2015-11-15 (×7): qty 1

## 2015-11-15 MED ORDER — ENOXAPARIN SODIUM 40 MG/0.4ML ~~LOC~~ SOLN
40.0000 mg | SUBCUTANEOUS | Status: DC
  Administered 2015-11-16 – 2015-11-18 (×3): 40 mg via SUBCUTANEOUS
  Filled 2015-11-15 (×3): qty 0.4

## 2015-11-15 MED ORDER — OXYCODONE HCL 5 MG PO TABS
5.0000 mg | ORAL_TABLET | ORAL | Status: DC | PRN
  Administered 2015-11-15 – 2015-11-16 (×4): 10 mg via ORAL
  Administered 2015-11-16 – 2015-11-17 (×2): 5 mg via ORAL
  Administered 2015-11-17 – 2015-11-18 (×6): 10 mg via ORAL
  Filled 2015-11-15 (×3): qty 2
  Filled 2015-11-15: qty 1
  Filled 2015-11-15 (×7): qty 2

## 2015-11-15 MED ORDER — HYDROMORPHONE HCL 2 MG/ML IJ SOLN
INTRAMUSCULAR | Status: AC
  Filled 2015-11-15: qty 1

## 2015-11-15 MED ORDER — PHENOL 1.4 % MT LIQD: 1.0000 | OROMUCOSAL | Status: DC | PRN

## 2015-11-15 MED ORDER — CEFAZOLIN SODIUM-DEXTROSE 2-4 GM/100ML-% IV SOLN
INTRAVENOUS | Status: AC
  Filled 2015-11-15: qty 100

## 2015-11-15 MED ORDER — SODIUM CHLORIDE 0.9 % IV SOLN
INTRAVENOUS | Status: DC
  Administered 2015-11-15: 12:00:00 via INTRAVENOUS

## 2015-11-15 MED ORDER — OXYCODONE HCL 5 MG PO TABS
ORAL_TABLET | ORAL | Status: AC
  Filled 2015-11-15: qty 2

## 2015-11-15 MED ORDER — ACETAMINOPHEN 10 MG/ML IV SOLN
INTRAVENOUS | Status: DC | PRN
  Administered 2015-11-15: 1000 mg via INTRAVENOUS

## 2015-11-15 MED ORDER — POLYETHYLENE GLYCOL 3350 17 G PO PACK: 17.0000 g | PACK | Freq: Every day | ORAL | Status: DC | PRN

## 2015-11-15 MED ORDER — SODIUM CHLORIDE 0.9 % IV SOLN
INTRAVENOUS | Status: DC | PRN
  Administered 2015-11-15: 1000 mL

## 2015-11-15 MED ORDER — MENTHOL 3 MG MT LOZG
1.0000 | LOZENGE | OROMUCOSAL | Status: DC | PRN
Start: 2015-11-15 — End: 2015-11-18

## 2015-11-15 MED ORDER — PHENYLEPHRINE 40 MCG/ML (10ML) SYRINGE FOR IV PUSH (FOR BLOOD PRESSURE SUPPORT)
PREFILLED_SYRINGE | INTRAVENOUS | Status: AC
  Filled 2015-11-15: qty 10

## 2015-11-15 MED ORDER — METOCLOPRAMIDE HCL 5 MG PO TABS: 5.0000 mg | ORAL_TABLET | Freq: Three times a day (TID) | ORAL | Status: DC | PRN

## 2015-11-15 MED ORDER — ROCURONIUM BROMIDE 10 MG/ML (PF) SYRINGE
PREFILLED_SYRINGE | INTRAVENOUS | Status: AC
  Filled 2015-11-15: qty 10

## 2015-11-15 MED ORDER — LACTATED RINGERS IV SOLN
INTRAVENOUS | Status: DC | PRN
  Administered 2015-11-15 (×2): via INTRAVENOUS

## 2015-11-15 MED ORDER — SUGAMMADEX SODIUM 200 MG/2ML IV SOLN
INTRAVENOUS | Status: AC
  Filled 2015-11-15: qty 2

## 2015-11-15 MED ORDER — LIDOCAINE 2% (20 MG/ML) 5 ML SYRINGE
INTRAMUSCULAR | Status: DC | PRN
  Administered 2015-11-15: 60 mg via INTRAVENOUS

## 2015-11-15 MED ORDER — ONDANSETRON HCL 4 MG/2ML IJ SOLN
INTRAMUSCULAR | Status: AC
  Filled 2015-11-15: qty 2

## 2015-11-15 MED ORDER — LIDOCAINE 2% (20 MG/ML) 5 ML SYRINGE
INTRAMUSCULAR | Status: AC
  Filled 2015-11-15: qty 5

## 2015-11-15 MED ORDER — KETOROLAC TROMETHAMINE 30 MG/ML IJ SOLN
INTRAMUSCULAR | Status: AC
  Filled 2015-11-15: qty 1

## 2015-11-15 MED ORDER — ONDANSETRON HCL 4 MG PO TABS: 4.0000 mg | ORAL_TABLET | Freq: Four times a day (QID) | ORAL | Status: DC | PRN

## 2015-11-15 MED ORDER — ACETAMINOPHEN 650 MG RE SUPP: 650.0000 mg | Freq: Four times a day (QID) | RECTAL | Status: DC | PRN

## 2015-11-15 MED ORDER — ALBUTEROL SULFATE (2.5 MG/3ML) 0.083% IN NEBU: 3.0000 mL | INHALATION_SOLUTION | Freq: Four times a day (QID) | RESPIRATORY_TRACT | Status: DC | PRN

## 2015-11-15 MED ORDER — SUGAMMADEX SODIUM 200 MG/2ML IV SOLN
INTRAVENOUS | Status: DC | PRN
  Administered 2015-11-15: 200 mg via INTRAVENOUS

## 2015-11-15 MED ORDER — CEFAZOLIN SODIUM-DEXTROSE 2-4 GM/100ML-% IV SOLN
2.0000 g | Freq: Four times a day (QID) | INTRAVENOUS | Status: AC
Start: 2015-11-15 — End: 2015-11-15
  Administered 2015-11-15 (×2): 2 g via INTRAVENOUS
  Filled 2015-11-15 (×2): qty 100

## 2015-11-15 MED ORDER — MIDAZOLAM HCL 2 MG/2ML IJ SOLN
INTRAMUSCULAR | Status: AC
  Filled 2015-11-15: qty 2

## 2015-11-15 MED ORDER — PROPOFOL 10 MG/ML IV BOLUS
INTRAVENOUS | Status: DC | PRN
  Administered 2015-11-15: 80 mg via INTRAVENOUS

## 2015-11-15 MED ORDER — PHENYLEPHRINE 40 MCG/ML (10ML) SYRINGE FOR IV PUSH (FOR BLOOD PRESSURE SUPPORT)
PREFILLED_SYRINGE | INTRAVENOUS | Status: DC | PRN
  Administered 2015-11-15: 120 ug via INTRAVENOUS
  Administered 2015-11-15 (×3): 80 ug via INTRAVENOUS

## 2015-11-15 MED ORDER — CEFAZOLIN SODIUM-DEXTROSE 2-3 GM-% IV SOLR
INTRAVENOUS | Status: DC | PRN
  Administered 2015-11-15: 2 g via INTRAVENOUS

## 2015-11-15 MED ORDER — FENTANYL CITRATE (PF) 100 MCG/2ML IJ SOLN
INTRAMUSCULAR | Status: AC
  Filled 2015-11-15: qty 4

## 2015-11-15 MED ORDER — PROPOFOL 10 MG/ML IV BOLUS
INTRAVENOUS | Status: AC
  Filled 2015-11-15: qty 40

## 2015-11-15 MED ORDER — 0.9 % SODIUM CHLORIDE (POUR BTL) OPTIME
TOPICAL | Status: DC | PRN
  Administered 2015-11-15: 1000 mL

## 2015-11-15 MED ORDER — METHOCARBAMOL 500 MG PO TABS
500.0000 mg | ORAL_TABLET | Freq: Four times a day (QID) | ORAL | Status: DC | PRN
  Administered 2015-11-16 – 2015-11-18 (×5): 500 mg via ORAL
  Filled 2015-11-15 (×5): qty 1

## 2015-11-15 MED ORDER — FENTANYL CITRATE (PF) 100 MCG/2ML IJ SOLN
INTRAMUSCULAR | Status: AC
  Filled 2015-11-15: qty 2

## 2015-11-15 MED ORDER — OXYCODONE HCL 5 MG PO TABS: 5.0000 mg | ORAL_TABLET | ORAL | 0 refills | Status: DC | PRN

## 2015-11-15 MED ORDER — METOCLOPRAMIDE HCL 5 MG/ML IJ SOLN
5.0000 mg | Freq: Three times a day (TID) | INTRAMUSCULAR | Status: DC | PRN
  Administered 2015-11-15: 10 mg via INTRAVENOUS

## 2015-11-15 MED ORDER — MIDAZOLAM HCL 5 MG/5ML IJ SOLN
INTRAMUSCULAR | Status: DC | PRN
  Administered 2015-11-15: 2 mg via INTRAVENOUS

## 2015-11-15 MED ORDER — DOCUSATE SODIUM 100 MG PO CAPS
100.0000 mg | ORAL_CAPSULE | Freq: Two times a day (BID) | ORAL | Status: DC
  Administered 2015-11-15 – 2015-11-18 (×7): 100 mg via ORAL
  Filled 2015-11-15 (×7): qty 1

## 2015-11-15 MED ORDER — METHOCARBAMOL 1000 MG/10ML IJ SOLN: 500.0000 mg | Freq: Four times a day (QID) | INTRAVENOUS | Status: DC | PRN

## 2015-11-15 MED ORDER — POTASSIUM CHLORIDE CRYS ER 20 MEQ PO TBCR
40.0000 meq | EXTENDED_RELEASE_TABLET | Freq: Once | ORAL | Status: AC
  Administered 2015-11-16: 40 meq via ORAL
  Filled 2015-11-15: qty 2

## 2015-11-15 MED ORDER — ONDANSETRON HCL 4 MG/2ML IJ SOLN
INTRAMUSCULAR | Status: DC | PRN
Start: 2015-11-15 — End: 2015-11-15
  Administered 2015-11-15: 4 mg via INTRAVENOUS

## 2015-11-15 MED ORDER — POVIDONE-IODINE 7.5 % EX SOLN
CUTANEOUS | Status: DC | PRN
  Administered 2015-11-15: 1 via TOPICAL

## 2015-11-15 MED ORDER — ONDANSETRON HCL 4 MG/2ML IJ SOLN: 4.0000 mg | Freq: Four times a day (QID) | INTRAMUSCULAR | Status: DC | PRN

## 2015-11-15 SURGICAL SUPPLY — 56 items
ADH SKN CLS APL DERMABOND .7 (GAUZE/BANDAGES/DRESSINGS) ×1
ALCOHOL ISOPROPYL (RUBBING) (MISCELLANEOUS) ×3 IMPLANT
BLADE SURG ROTATE 9660 (MISCELLANEOUS) IMPLANT
CAPT HIP TOTAL 2 ×2 IMPLANT
CHLORAPREP W/TINT 26ML (MISCELLANEOUS) ×3 IMPLANT
COVER SURGICAL LIGHT HANDLE (MISCELLANEOUS) ×3 IMPLANT
DERMABOND ADVANCED (GAUZE/BANDAGES/DRESSINGS) ×2
DERMABOND ADVANCED .7 DNX12 (GAUZE/BANDAGES/DRESSINGS) ×2 IMPLANT
DRAPE C-ARM 42X72 X-RAY (DRAPES) ×3 IMPLANT
DRAPE IMP U-DRAPE 54X76 (DRAPES) ×6 IMPLANT
DRAPE STERI IOBAN 125X83 (DRAPES) ×3 IMPLANT
DRAPE U-SHAPE 47X51 STRL (DRAPES) ×9 IMPLANT
DRSG AQUACEL AG ADV 3.5X10 (GAUZE/BANDAGES/DRESSINGS) ×3 IMPLANT
ELECT BLADE 4.0 EZ CLEAN MEGAD (MISCELLANEOUS) ×3
ELECT REM PT RETURN 9FT ADLT (ELECTROSURGICAL) ×3
ELECTRODE BLDE 4.0 EZ CLN MEGD (MISCELLANEOUS) ×1 IMPLANT
ELECTRODE REM PT RTRN 9FT ADLT (ELECTROSURGICAL) ×1 IMPLANT
EVACUATOR 1/8 PVC DRAIN (DRAIN) IMPLANT
GLOVE BIO SURGEON STRL SZ8.5 (GLOVE) ×6 IMPLANT
GLOVE BIOGEL PI IND STRL 8.5 (GLOVE) ×1 IMPLANT
GLOVE BIOGEL PI INDICATOR 8.5 (GLOVE) ×2
GOWN STRL REUS W/ TWL LRG LVL3 (GOWN DISPOSABLE) ×2 IMPLANT
GOWN STRL REUS W/TWL 2XL LVL3 (GOWN DISPOSABLE) ×3 IMPLANT
GOWN STRL REUS W/TWL LRG LVL3 (GOWN DISPOSABLE) ×6
HANDPIECE INTERPULSE COAX TIP (DISPOSABLE) ×3
HOOD PEEL AWAY FACE SHEILD DIS (HOOD) ×6 IMPLANT
KIT BASIN OR (CUSTOM PROCEDURE TRAY) ×3 IMPLANT
KIT ROOM TURNOVER OR (KITS) ×3 IMPLANT
MANIFOLD NEPTUNE II (INSTRUMENTS) ×3 IMPLANT
MARKER SKIN DUAL TIP RULER LAB (MISCELLANEOUS) ×6 IMPLANT
NDL SPNL 18GX3.5 QUINCKE PK (NEEDLE) ×1 IMPLANT
NEEDLE SPNL 18GX3.5 QUINCKE PK (NEEDLE) ×3 IMPLANT
NS IRRIG 1000ML POUR BTL (IV SOLUTION) ×3 IMPLANT
PACK TOTAL JOINT (CUSTOM PROCEDURE TRAY) ×3 IMPLANT
PACK UNIVERSAL I (CUSTOM PROCEDURE TRAY) ×3 IMPLANT
PAD ARMBOARD 7.5X6 YLW CONV (MISCELLANEOUS) ×6 IMPLANT
SAW OSC TIP CART 19.5X105X1.3 (SAW) ×3 IMPLANT
SEALER BIPOLAR AQUA 6.0 (INSTRUMENTS) IMPLANT
SET HNDPC FAN SPRY TIP SCT (DISPOSABLE) ×1 IMPLANT
SOLUTION BETADINE 4OZ (MISCELLANEOUS) ×3 IMPLANT
SUCTION FRAZIER HANDLE 10FR (MISCELLANEOUS) ×2
SUCTION TUBE FRAZIER 10FR DISP (MISCELLANEOUS) ×1 IMPLANT
SUT ETHIBOND NAB CT1 #1 30IN (SUTURE) ×6 IMPLANT
SUT MNCRL AB 3-0 PS2 18 (SUTURE) ×3 IMPLANT
SUT MON AB 2-0 CT1 36 (SUTURE) ×3 IMPLANT
SUT VIC AB 1 CT1 27 (SUTURE) ×3
SUT VIC AB 1 CT1 27XBRD ANBCTR (SUTURE) ×1 IMPLANT
SUT VIC AB 2-0 CT1 27 (SUTURE) ×3
SUT VIC AB 2-0 CT1 TAPERPNT 27 (SUTURE) ×1 IMPLANT
SUT VLOC 180 0 24IN GS25 (SUTURE) ×3 IMPLANT
SYR 50ML LL SCALE MARK (SYRINGE) ×3 IMPLANT
TOWEL OR 17X24 6PK STRL BLUE (TOWEL DISPOSABLE) ×3 IMPLANT
TOWEL OR 17X26 10 PK STRL BLUE (TOWEL DISPOSABLE) ×3 IMPLANT
TRAY CATH 16FR W/PLASTIC CATH (SET/KITS/TRAYS/PACK) IMPLANT
TRAY FOLEY CATH 16FR SILVER (SET/KITS/TRAYS/PACK) IMPLANT
WATER STERILE IRR 1000ML POUR (IV SOLUTION) ×9 IMPLANT

## 2015-11-15 NOTE — Anesthesia Procedure Notes (Signed)
Procedure Name: Intubation Date/Time: 11/15/2015 7:42 AM Performed by: Everlean Cherry A Pre-anesthesia Checklist: Patient identified, Emergency Drugs available, Suction available and Patient being monitored Patient Re-evaluated:Patient Re-evaluated prior to inductionOxygen Delivery Method: Circle system utilized Preoxygenation: Pre-oxygenation with 100% oxygen Intubation Type: IV induction Ventilation: Mask ventilation without difficulty Laryngoscope Size: Glidescope and 3 Grade View: Grade I Tube type: Oral Tube size: 7.0 mm Number of attempts: 1 Airway Equipment and Method: Rigid stylet and Video-laryngoscopy Placement Confirmation: ETT inserted through vocal cords under direct vision,  positive ETCO2 and breath sounds checked- equal and bilateral Secured at: 20 cm Tube secured with: Tape Dental Injury: Teeth and Oropharynx as per pre-operative assessment  Difficulty Due To: Difficult Airway- due to reduced neck mobility and Difficulty was anticipated Comments: DL x1 with glidescope due to previous neck fusion and limited range of motion.

## 2015-11-15 NOTE — Discharge Instructions (Signed)
°Dr. Jarin Cornfield °Joint Replacement Specialist °[facility] °[address] °[place],  [address] °[phone] ° ° °TOTAL HIP REPLACEMENT POSTOPERATIVE DIRECTIONS ° ° ° °Hip Rehabilitation, Guidelines Following Surgery  ° °WEIGHT BEARING °Weight bearing as tolerated with assist device (walker, cane, etc) as directed, use it as long as suggested by your surgeon or therapist, typically at least 4-6 weeks. ° °The results of a hip operation are greatly improved after range of motion and muscle strengthening exercises. Follow all safety measures which are given to protect your hip. If any of these exercises cause increased pain or swelling in your joint, decrease the amount until you are comfortable again. Then slowly increase the exercises. Call your caregiver if you have problems or questions.  ° °HOME CARE INSTRUCTIONS  °Most of the following instructions are designed to prevent the dislocation of your new hip.  °Remove items at home which could result in a fall. This includes throw rugs or furniture in walking pathways.  °Continue medications as instructed at time of discharge. °· You may have some home medications which will be placed on hold until you complete the course of blood thinner medication. °· You may start showering once you are discharged home. Do not remove your dressing. °Do not put on socks or shoes without following the instructions of your caregivers.   °Sit on chairs with arms. Use the chair arms to help push yourself up when arising.  °Arrange for the use of a toilet seat elevator so you are not sitting low.  °· Walk with walker as instructed.  °You may resume a sexual relationship in one month or when given the OK by your caregiver.  °Use walker as long as suggested by your caregivers.  °You may put full weight on your legs and walk as much as is comfortable. °Avoid periods of inactivity such as sitting longer than an hour when not asleep. This helps prevent  blood clots.  °You may return to work once you are cleared by your surgeon.  °Do not drive a car for 6 weeks or until released by your surgeon.  °Do not drive while taking narcotics.  °Wear elastic stockings for two weeks following surgery during the day but you may remove then at night.  °Make sure you keep all of your appointments after your operation with all of your doctors and caregivers. You should call the office at the above phone number and make an appointment for approximately two weeks after the date of your surgery. °Please pick up a stool softener and laxative for home use as long as you are requiring pain medications. °· ICE to the affected hip every three hours for 30 minutes at a time and then as needed for pain and swelling. Continue to use ice on the hip for pain and swelling from surgery. You may notice swelling that will progress down to the foot and ankle.  This is normal after surgery.  Elevate the leg when you are not up walking on it.   °It is important for you to complete the blood thinner medication as prescribed by your doctor. °· Continue to use the breathing machine which will help keep your temperature down.  It is common for your temperature to cycle up and down following surgery, especially at night when you are not up moving around and exerting yourself.  The breathing machine keeps your lungs expanded and your temperature down. ° °RANGE OF MOTION AND STRENGTHENING EXERCISES  °These exercises are   designed to help you keep full movement of your hip joint. Follow your caregiver's or physical therapist's instructions. Perform all exercises about fifteen times, three times per day or as directed. Exercise both hips, even if you have had only one joint replacement. These exercises can be done on a training (exercise) mat, on the floor, on a table or on a bed. Use whatever works the best and is most comfortable for you. Use music or television while you are exercising so that the exercises  are a pleasant break in your day. This will make your life better with the exercises acting as a break in routine you can look forward to.  °Lying on your back, slowly slide your foot toward your buttocks, raising your knee up off the floor. Then slowly slide your foot back down until your leg is straight again.  °Lying on your back spread your legs as far apart as you can without causing discomfort.  °Lying on your side, raise your upper leg and foot straight up from the floor as far as is comfortable. Slowly lower the leg and repeat.  °Lying on your back, tighten up the muscle in the front of your thigh (quadriceps muscles). You can do this by keeping your leg straight and trying to raise your heel off the floor. This helps strengthen the largest muscle supporting your knee.  °Lying on your back, tighten up the muscles of your buttocks both with the legs straight and with the knee bent at a comfortable angle while keeping your heel on the floor.  ° °SKILLED REHAB INSTRUCTIONS: °If the patient is transferred to a skilled rehab facility following release from the hospital, a list of the current medications will be sent to the facility for the patient to continue.  When discharged from the skilled rehab facility, please have the facility set up the patient's Home Health Physical Therapy prior to being released. Also, the skilled facility will be responsible for providing the patient with their medications at time of release from the facility to include their pain medication and their blood thinner medication. If the patient is still at the rehab facility at time of the two week follow up appointment, the skilled rehab facility will also need to assist the patient in arranging follow up appointment in our office and any transportation needs. ° °MAKE SURE YOU:  °Understand these instructions.  °Will watch your condition.  °Will get help right away if you are not doing well or get worse. ° °Pick up stool softner and  laxative for home use following surgery while on pain medications. °Do not remove your dressing. °The dressing is waterproof--it is OK to take showers. °Continue to use ice for pain and swelling after surgery. °Do not use any lotions or creams on the incision until instructed by your surgeon. °Total Hip Protocol. ° ° °

## 2015-11-15 NOTE — Op Note (Signed)
OPERATIVE REPORT  SURGEON: Rod Can, MD   ASSISTANT: Ky Barban, RNFA.  PREOPERATIVE DIAGNOSIS: Chronic displaced Right femoral neck fracture.   POSTOPERATIVE DIAGNOSIS: Chronic displaced Right femoral neck fracture.   PROCEDURE: Right total hip arthroplasty, anterior approach.   IMPLANTS: Biomet Taperloc micro-plasty stem, size 14, hi offset. Biomet G7 cup, size 52 mm. Biomet E1 liner, size 32 mm neutral. Biomet Biolox ceramic head ball, size 32 -3 mm.  ANESTHESIA:  General  ESTIMATED BLOOD LOSS: 250 mL.  ANTIBIOTICS: 2 g Ancef.  DRAINS: None.  SPECIMENS: Right femoral head and neck remnant for permanent pathology.  COMPLICATIONS: None.   CONDITION: PACU - hemodynamically stable.  BRIEF CLINICAL NOTE: Jennifer Stevens is a 60 y.o. female with a displaced Right femoral neck fracture. After failing conservative management, the patient was indicated for total hip arthroplasty. The risks, benefits, and alternatives to the procedure were explained, and the patient elected to proceed.  PROCEDURE IN DETAIL: Surgical site was marked by myself. Spinal anesthesia was obtained in the pre-op holding area. Once inside the operative room, a foley catheter was inserted. The patient was then positioned on the Hana table. All bony prominences were well padded. The hip was prepped and draped in the normal sterile surgical fashion. A time-out was called verifying side and site of surgery. The patient received IV antibiotics within 60 minutes of beginning the procedure.  The direct anterior approach to the hip was performed through the Hueter interval. Lateral femoral circumflex vessels were treated with the Auqumantys. The anterior capsule was exposed and an inverted T capsulotomy was made.The fracture hematoma was encountered. She had a chronic appearing displaced subcapital femoral neck fracture. The femoral neck cut was made to the level of the templated cut. A corkscrew was  placed into the head and the head was removed. There was no evidence of tumor. The head was passed to the back table and was measured. The head was then sent as a specimen to permanent pathology.  Acetabular exposure was achieved, and the pulvinar and labrum were excised. Sequental reaming of the acetabulum was then performed up to a size 51 mm reamer. A 52 mm cup was then opened and impacted into place at approximately 40 degrees of abduction and 20 degrees of anteversion. The final polyethylene liner was impacted into place and acetabular osteophytes were removed.   I then gained femoral exposure taking care to protect the abductors and greater trochanter. This was performed using standard external rotation, extension, and adduction. The capsule was peeled off the inner aspect of the greater trochanter, taking care to preserve the short external rotators. A cookie cutter was used to enter the femoral canal, and then the femoral canal finder was placed. Sequential broaching was performed up to a size 14. Calcar planer was used on the femoral neck remnant. I placed a hi offset neck and a trial head ball. The hip was reduced. Leg lengths and offset were checked fluoroscopically. In order to maintain appropriate soft tissue tension, mild lengthening of the extremity was required. The hip was dislocated and trial components were removed. The final implants were placed, and the hip was reduced.  Fluoroscopy was used to confirm component position and leg lengths. At 90 degrees of external rotation and full extension, the hip was stable to an anterior directed force.  The wound was copiously irrigated with a dilute betadine solution followed by normal saline. Marcaine solution was injected into the periarticular soft tissue. The wound was closed in layers  using #1 Vicryl and V-Loc for the fascia, 2-0 Vicryl for the subcutaneous fat, 2-0 Monocryl for the deep dermal layer, 3-0 running Monocryl  subcuticular stitch, and Dermabond for the skin. Once the glue was fully dried, an Aquacell Ag dressing was applied. The patient was transported to the recovery room in stable condition. Sponge, needle, and instrument counts were correct at the end of the case x2. The patient tolerated the procedure well and there were no known complications.

## 2015-11-15 NOTE — Anesthesia Postprocedure Evaluation (Signed)
Anesthesia Post Note  Patient: Jennifer Stevens  Procedure(s) Performed: Procedure(s) (LRB): TOTAL HIP ARTHROPLASTY ANTERIOR APPROACH (Right)  Patient location during evaluation: PACU Anesthesia Type: Spinal Level of consciousness: awake and alert Pain management: pain level controlled Vital Signs Assessment: post-procedure vital signs reviewed and stable Respiratory status: spontaneous breathing, nonlabored ventilation, respiratory function stable and patient connected to nasal cannula oxygen Cardiovascular status: blood pressure returned to baseline and stable Postop Assessment: no signs of nausea or vomiting Anesthetic complications: no    Last Vitals:  Vitals:   11/15/15 1130 11/15/15 1145  BP: 131/78 (!) 142/83  Pulse: 78 77  Resp: 10 16  Temp:  36.4 C    Last Pain:  Vitals:   11/15/15 1145  TempSrc: Oral  PainSc: 3                  Montez Hageman

## 2015-11-15 NOTE — Progress Notes (Signed)
Patient ID: Jennifer Stevens, female   DOB: 08-22-1955, 60 y.o.   MRN: [id number]    PROGRESS NOTE    Jennifer Stevens  YBO:[id number] DOB: Dec 17, 1955 DOA: 11/13/2015  PCP: No primary care provider on file.   Brief Narrative:  60 y.o. female with medical history significant of anxiety, PTSD, Szrs presenting w/ 3 day history of right hip pain. Fall sustained after left knee gave out. 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 Patient's right hip struck the floor. Immediately painful.   Assessment & Plan:  Right hip intracapsular fracture, requiring surgery. - still in pain - will be taken to OR this AM  - appreciate ortho team following  - PT/OT eval post op    Seizures ([place]) - stable for now    Hypokalemia - supplement and repeat BMP in AM   DVT prophylaxis: Lovenox SQ Code Status: Full Family Communication: Patient at bedside  Disposition Plan: To be determined post op  Consultants:   Ortho  Procedures:   None  Antimicrobials:   Pre op ABX   Subjective: Still with right hip pain.  Objective: Vitals:   11/14/15 0355 11/14/15 1530 11/14/15 2011 11/15/15 0505  BP: (!) 114/91 123/82 107/73 98/61  Pulse: 73  72 74  Resp: '17 18 17 16  '$ Temp: 98 F (36.7 C) 98.1 F (36.7 C) 98.6 F (37 C) 98.6 F (37 C)  TempSrc: Oral Oral Oral Oral  SpO2: 92% 96% 93% 92%    Intake/Output Summary (Last 24 hours) at 11/15/15 1003 Last data filed at 11/15/15 0940  Gross per 24 hour  Intake             3130 ml  Output              250 ml  Net             2880 ml   There were no vitals filed for this visit.  Examination:  General exam: Appears calm and comfortable  Respiratory system: Clear to auscultation. Respiratory effort normal. Cardiovascular system: S1 & S2 heard, RRR. No JVD, murmurs, rubs, gallops or clicks. No pedal edema. Gastrointestinal system: Abdomen is nondistended, soft and nontender. No organomegaly or masses felt. Normal bowel  sounds  Data Reviewed: I have personally reviewed following labs and imaging studies  CBC:  Recent Labs Lab 11/13/15 1336 11/14/15 0528 11/15/15 0153  WBC 6.0 3.9* 4.5  NEUTROABS 4.6  --   --   HGB 12.1 12.6 10.8*  HCT 35.2* 37.5 32.3*  MCV 93.1 93.8 93.9  PLT 272 329 277   Basic Metabolic Panel:  Recent Labs Lab 11/13/15 1336 11/13/15 1845 11/14/15 0528 11/15/15 0153  NA 130*  --  133* 136  K 3.4*  --  3.5 3.2*  CL 96*  --  103 106  CO2 26  --  23 23  GLUCOSE 113*  --  105* 90  BUN 8  --  5* <5*  CREATININE 0.66  --  0.60 0.50  CALCIUM 8.7*  --  8.4* 8.1*  MG  --  2.1  --   --   PHOS  --  3.4  --   --    Coagulation Profile:  Recent Labs Lab 11/13/15 1845  INR 1.03   CBG:  Recent Labs Lab 11/13/15 1827 11/13/15 2105 11/14/15 0621  GLUCAP 104* 104* 112*   Recent Results (from the past 240 hour(s))  MRSA PCR Screening  Status: None   Collection Time: 11/13/15  6:00 PM  Result Value Ref Range Status   MRSA by PCR NEGATIVE NEGATIVE Final    Comment:        The GeneXpert MRSA Assay (FDA approved for NASAL specimens only), is one component of a comprehensive MRSA colonization surveillance program. It is not intended to diagnose MRSA infection nor to guide or monitor treatment for MRSA infections.       Radiology Studies: Dg Chest Port 1 View  Result Date: 11/13/2015 CLINICAL DATA:  Preop total hip arthroplasty EXAM: PORTABLE CHEST 1 VIEW COMPARISON:  12/23/2009 FINDINGS: Lungs are clear.  No pleural effusion or pneumothorax. The heart is normal in size. IMPRESSION: No evidence of acute cardiopulmonary disease. Electronically Signed   By: Julian Hy M.D.   On: 11/13/2015 18:22   Dg Hip Unilat  With Pelvis 2-3 Views Right  Result Date: 11/13/2015 CLINICAL DATA:  Status post fall 11/09/2012 with onset of right hip pain. Initial encounter. EXAM: DG HIP (WITH OR WITHOUT PELVIS) 2-3V RIGHT COMPARISON:  None. FINDINGS: There is a subcapital  right hip fracture. The fracture is age indeterminate. There appears to be osteolysis of the inferior portion of the femoral head and proximal femoral neck. No dislocation. No other bony or joint abnormality. IMPRESSION: Subcapital right hip fracture cannot be definitively characterized but appears subacute to remote. Electronically Signed   By: Inge Rise M.D.   On: 11/13/2015 13:26   Dg Femur Min 2 Views Right  Result Date: 11/13/2015 CLINICAL DATA:  Right upper leg pain since a fall 11/10/2015. Initial encounter. EXAM: RIGHT FEMUR 2 VIEWS COMPARISON:  Plain films right hip this same day. FINDINGS: Right femoral neck fracture is identified. No other bony or joint abnormality is seen. IMPRESSION: Right femoral neck fracture as seen on the comparison examination. The study is otherwise negative. Electronically Signed   By: Inge Rise M.D.   On: 11/13/2015 13:27      Scheduled Meds: . [MAR Hold] acetaminophen  1,000 mg Oral Q6H  . [MAR Hold] ALPRAZolam  1 mg Oral TID  . [MAR Hold] amphetamine-dextroamphetamine  20 mg Oral Daily  . ceFAZolin      .  ceFAZolin (ANCEF) IV  3 g Intravenous On Call to OR  . [MAR Hold] gabapentin  300 mg Oral TID  . [MAR Hold] nicotine  14 mg Transdermal Daily  . povidone-iodine  2 application Topical Once  . povidone-iodine  2 application Topical Once  . [MAR Hold] QUEtiapine  100 mg Oral BID  . [MAR Hold] QUEtiapine  300 mg Oral BID  . [MAR Hold] traZODone  300 mg Oral QHS   Continuous Infusions: . sodium chloride 75 mL/hr at 11/14/15 0556  . sodium chloride 1,000 mL (11/15/15 0827)     LOS: 2 days    Time spent: 20 minutes    Faye Ramsay, MD [facility] Pager [phone]  If 7PM-7AM, please contact night-coverage [email or web address] Password [email or web address] 11/15/2015, 10:03 AM

## 2015-11-15 NOTE — Progress Notes (Signed)
Orthopedic Tech Progress Note Patient Details:  Jennifer Stevens [birth year] [id number]  Ortho Devices Ortho Device/Splint Location: Trapeze bar Ortho Device/Splint Interventions: Application   [place] Pink 11/15/2015, 6:36 PM

## 2015-11-15 NOTE — Interval H&P Note (Signed)
History and Physical Interval Note:  11/15/2015 7:50 AM  Jennifer Stevens  has presented today for surgery, with the diagnosis of RIGHT DISPLACED FEMORAL NECK FRACTURE  The various methods of treatment have been discussed with the patient and family. After consideration of risks, benefits and other options for treatment, the patient has consented to  Procedure(s): TOTAL HIP ARTHROPLASTY ANTERIOR APPROACH (Right) as a surgical intervention .  The patient's history has been reviewed, patient examined, no change in status, stable for surgery.  I have reviewed the patient's chart and labs.  Questions were answered to the patient's satisfaction.    The risks, benefits, and alternatives were discussed with the patient. There are risks associated with the surgery including, but not limited to, problems with anesthesia (death), infection, instability (giving out of the joint), dislocation, differences in leg length/angulation/rotation, fracture of bones, loosening or failure of implants, hematoma (blood accumulation) which may require surgical drainage, blood clots, pulmonary embolism, nerve injury (foot drop and lateral thigh numbness), and blood vessel injury. The patient understands these risks and elects to proceed.    Jadaya Sommerfield, Horald Pollen

## 2015-11-15 NOTE — Discharge Summary (Signed)
Physician Discharge Summary  Patient ID: ZION LINT MRN: [id number] DOB/AGE: 1955/09/05 60 y.o.  Admit date: 11/13/2015 Discharge date: 11/18/2015  Admission Diagnoses:  Closed right hip fracture [facility])  Discharge Diagnoses:  Principal Problem:   Closed right hip fracture ([place]) Active Problems:   Seizures ([place])   PTSD (post-traumatic stress disorder)   Anxiety   Tobacco use disorder   Closed fracture of right hip ([place])   Fall   Preop examination   Closed displaced fracture of right femoral neck ([facility])   Past Medical History:  Diagnosis Date  . Anxiety   . PTSD (post-traumatic stress disorder)   . Seizure in infant [facility])    suspect febrile seizure. Pt unsure of history.     Surgeries: Procedure(s): TOTAL HIP ARTHROPLASTY ANTERIOR APPROACH on 11/15/2015   Consultants (if any): Treatment Team:  Nicholes Stairs, MD  Discharged Condition: Improved  Hospital Course: JIMEKA BALAN is an 60 y.o. female who was admitted 11/13/2015 with a diagnosis of Closed right hip fracture ([facility]) and went to the operating room on  11/15/2015 and underwent the above named procedures.    She was given perioperative antibiotics:  Anti-infectives    Start     Dose/Rate Route Frequency Ordered Stop   11/15/15 1400  ceFAZolin (ANCEF) IVPB 2g/100 mL premix     2 g 200 mL/hr over 30 Minutes Intravenous Every 6 hours 11/15/15 1158 11/15/15 2110   11/15/15 0715  ceFAZolin (ANCEF) 3 g in dextrose 5 % 50 mL IVPB  Status:  Discontinued     3 g 130 mL/hr over 30 Minutes Intravenous On call to O.R. 11/13/15 2045 11/15/15 1158   11/15/15 0707  ceFAZolin (ANCEF) 2-4 GM/100ML-% IVPB    Comments:  Everlean Cherry   : cabinet override      11/15/15 0707 11/15/15 1914   11/15/15 0700  ceFAZolin (ANCEF) 3 g in dextrose 5 % 50 mL IVPB  Status:  Discontinued     3 g 130 mL/hr over 30 Minutes Intravenous On call to O.R. 11/13/15 2040 11/13/15 2047   11/14/15 0600  ceFAZolin (ANCEF) IVPB 2g/100 mL  premix  Status:  Discontinued     2 g 200 mL/hr over 30 Minutes Intravenous On call to O.R. 11/13/15 1654 11/13/15 1658   11/14/15 0600  ceFAZolin (ANCEF) 3 g in dextrose 5 % 50 mL IVPB  Status:  Discontinued     3 g 130 mL/hr over 30 Minutes Intravenous On call to O.R. 11/13/15 1654 11/13/15 1659   11/14/15 0600  ceFAZolin (ANCEF) IVPB 2g/100 mL premix  Status:  Discontinued     2 g 200 mL/hr over 30 Minutes Intravenous On call to O.R. 11/13/15 1733 11/13/15 1737   11/13/15 1800  ceFAZolin (ANCEF) 3 g in dextrose 5 % 50 mL IVPB  Status:  Discontinued     3 g 130 mL/hr over 30 Minutes Intravenous On call to O.R. 11/13/15 1659 11/13/15 2046    .  She was given sequential compression devices, early ambulation, and apixaban for DVT prophylaxis.  She benefited maximally from the hospital stay and there were no complications.    Recent vital signs:  Vitals:   11/17/15 2049 11/18/15 0536  BP: (!) 94/55 93/74  Pulse: 88 86  Resp: 16 16  Temp: 97.9 F (36.6 C) 98.7 F (37.1 C)    Recent laboratory studies:  Lab Results  Component Value Date   HGB 8.4 (L) 11/18/2015   HGB 8.9 (L) 11/17/2015  HGB 8.2 (L) 11/16/2015   Lab Results  Component Value Date   WBC 5.6 11/18/2015   PLT 316 11/18/2015   Lab Results  Component Value Date   INR 1.03 11/13/2015   Lab Results  Component Value Date   NA 136 11/17/2015   K 3.9 11/17/2015   CL 105 11/17/2015   CO2 24 11/17/2015   BUN <5 (L) 11/17/2015   CREATININE 0.43 (L) 11/17/2015   GLUCOSE 114 (H) 11/17/2015    Discharge Medications:     Medication List    STOP taking these medications   HYDROcodone-acetaminophen 10-325 MG tablet Commonly known as:  NORCO     TAKE these medications   albuterol 108 (90 Base) MCG/ACT inhaler Commonly known as:  PROVENTIL HFA;VENTOLIN HFA Inhale 1-2 puffs into the lungs every 6 (six) hours as needed for wheezing or shortness of breath.   ALPRAZolam 1 MG tablet Commonly known as:   XANAX Take 1 mg by mouth 3 (three) times daily.   amphetamine-dextroamphetamine 20 MG tablet Commonly known as:  ADDERALL Take 20 mg by mouth daily.   apixaban 2.5 MG Tabs tablet Commonly known as:  ELIQUIS Take 1 tablet (2.5 mg total) by mouth 2 (two) times daily.   gabapentin 300 MG capsule Commonly known as:  NEURONTIN Take 300 mg by mouth 3 (three) times daily.   oxyCODONE 5 MG immediate release tablet Commonly known as:  Oxy IR/ROXICODONE Take 1-2 tablets (5-10 mg total) by mouth every 3 (three) hours as needed for moderate pain.   polyethylene glycol packet Commonly known as:  MIRALAX / GLYCOLAX Take 17 g by mouth daily as needed for mild constipation.   QUEtiapine 100 MG tablet Commonly known as:  SEROQUEL Take 100 mg by mouth 2 (two) times daily.   QUEtiapine 300 MG tablet Commonly known as:  SEROQUEL Take 300 mg by mouth 2 (two) times daily.   senna 8.6 MG Tabs tablet Commonly known as:  SENOKOT Take 1 tablet (8.6 mg total) by mouth 2 (two) times daily.   tiZANidine 4 MG tablet Commonly known as:  ZANAFLEX Take 4 mg by mouth every 6 (six) hours as needed for muscle spasms.   traZODone 150 MG tablet Commonly known as:  DESYREL Take 300 mg by mouth at bedtime.            Durable Medical Equipment        Start     Ordered   11/17/15 1103  For home use only DME Walker  Once     11/17/15 1102   11/17/15 1102  For home use only DME 3 n 1  Once     11/17/15 1102      Diagnostic Studies: Pelvis Portable  Result Date: 11/15/2015 CLINICAL DATA:  Status post right hip replacement EXAM: PORTABLE PELVIS 1-2 VIEWS COMPARISON:  None. FINDINGS: Total right hip arthroplasty without periprosthetic fracture or malalignment in the frontal projection. Negative visualized pelvis and left hip. Prominent stool volume. IMPRESSION: New total right hip arthroplasty without adverse finding. Electronically Signed   By: Monte Fantasia M.D.   On: 11/15/2015 10:52   Dg  Chest Port 1 View  Result Date: 11/13/2015 CLINICAL DATA:  Preop total hip arthroplasty EXAM: PORTABLE CHEST 1 VIEW COMPARISON:  12/23/2009 FINDINGS: Lungs are clear.  No pleural effusion or pneumothorax. The heart is normal in size. IMPRESSION: No evidence of acute cardiopulmonary disease. Electronically Signed   By: Julian Hy M.D.   On: 11/13/2015 18:22  Dg C-arm 61-120 Min  Result Date: 11/15/2015 CLINICAL DATA:  Right hip arthroplasty EXAM: DG C-ARM 61-120 MIN; OPERATIVE RIGHT HIP WITH PELVIS COMPARISON:  None FLUOROSCOPY TIME:  21 seconds FINDINGS: Interval right total hip arthroplasty without failure or complication. No obvious dislocation. IMPRESSION: Interval right total hip arthroplasty. Electronically Signed   By: Kathreen Devoid   On: 11/15/2015 10:19   Dg Hip Operative Unilat W Or W/o Pelvis Right  Result Date: 11/15/2015 CLINICAL DATA:  Right hip arthroplasty EXAM: DG C-ARM 61-120 MIN; OPERATIVE RIGHT HIP WITH PELVIS COMPARISON:  None FLUOROSCOPY TIME:  21 seconds FINDINGS: Interval right total hip arthroplasty without failure or complication. No obvious dislocation. IMPRESSION: Interval right total hip arthroplasty. Electronically Signed   By: Kathreen Devoid   On: 11/15/2015 10:19   Dg Hip Unilat  With Pelvis 2-3 Views Right  Result Date: 11/13/2015 CLINICAL DATA:  Status post fall 11/09/2012 with onset of right hip pain. Initial encounter. EXAM: DG HIP (WITH OR WITHOUT PELVIS) 2-3V RIGHT COMPARISON:  None. FINDINGS: There is a subcapital right hip fracture. The fracture is age indeterminate. There appears to be osteolysis of the inferior portion of the femoral head and proximal femoral neck. No dislocation. No other bony or joint abnormality. IMPRESSION: Subcapital right hip fracture cannot be definitively characterized but appears subacute to remote. Electronically Signed   By: Inge Rise M.D.   On: 11/13/2015 13:26   Dg Femur Min 2 Views Right  Result Date:  11/13/2015 CLINICAL DATA:  Right upper leg pain since a fall 11/10/2015. Initial encounter. EXAM: RIGHT FEMUR 2 VIEWS COMPARISON:  Plain films right hip this same day. FINDINGS: Right femoral neck fracture is identified. No other bony or joint abnormality is seen. IMPRESSION: Right femoral neck fracture as seen on the comparison examination. The study is otherwise negative. Electronically Signed   By: Inge Rise M.D.   On: 11/13/2015 13:27    Disposition:   Discharge Instructions    Call MD / Call 911    Complete by:  As directed    If you experience chest pain or shortness of breath, CALL 911 and be transported to the hospital emergency room.  If you develope a fever above 101 F, pus (white drainage) or increased drainage or redness at the wound, or calf pain, call your surgeon's office.   Call MD / Call 911    Complete by:  As directed    If you experience chest pain or shortness of breath, CALL 911 and be transported to the hospital emergency room.  If you develope a fever above 101 F, pus (white drainage) or increased drainage or redness at the wound, or calf pain, call your surgeon's office.   Constipation Prevention    Complete by:  As directed    Drink plenty of fluids.  Prune juice may be helpful.  You may use a stool softener, such as Colace (over the counter) 100 mg twice a day.  Use MiraLax (over the counter) for constipation as needed.   Constipation Prevention    Complete by:  As directed    Drink plenty of fluids.  Prune juice may be helpful.  You may use a stool softener, such as Colace (over the counter) 100 mg twice a day.  Use MiraLax (over the counter) for constipation as needed.   Diet - low sodium heart healthy    Complete by:  As directed    Diet - low sodium heart healthy    Complete  by:  As directed    Discharge instructions    Complete by:  As directed    INSTRUCTIONS AFTER JOINT REPLACEMENT   Remove items at home which could result in a fall. This includes  throw rugs or furniture in walking pathways ICE to the affected joint every three hours while awake for 30 minutes at a time, for at least the first 3-5 days, and then as needed for pain and swelling.  Continue to use ice for pain and swelling. You may notice swelling that will progress down to the foot and ankle.  This is normal after surgery.  Elevate your leg when you are not up walking on it.   Continue to use the breathing machine you got in the hospital (incentive spirometer) which will help keep your temperature down.  It is common for your temperature to cycle up and down following surgery, especially at night when you are not up moving around and exerting yourself.  The breathing machine keeps your lungs expanded and your temperature down.   DIET:  As you were doing prior to hospitalization, we recommend a well-balanced diet.  DRESSING / WOUND CARE / SHOWERING  Keep the surgical dressing until follow up.  The dressing is water proof, so you can shower without any extra covering.  IF THE DRESSING FALLS OFF or the wound gets wet inside, change the dressing with sterile gauze.  Please use good hand washing techniques before changing the dressing.  Do not use any lotions or creams on the incision until instructed by your surgeon.    ACTIVITY  Increase activity slowly as tolerated, but follow the weight bearing instructions below.   No driving for 6 weeks or until further direction given by your physician.  You cannot drive while taking narcotics.  No lifting or carrying greater than 10 lbs. until further directed by your surgeon. Avoid periods of inactivity such as sitting longer than an hour when not asleep. This helps prevent blood clots.  You may return to work once you are authorized by your doctor.     WEIGHT BEARING   Weight bearing as tolerated with assist device (walker, cane, etc) as directed, use it as long as suggested by your surgeon or therapist, typically at least 4-6  weeks.   EXERCISES  Results after joint replacement surgery are often greatly improved when you follow the exercise, range of motion and muscle strengthening exercises prescribed by your doctor. Safety measures are also important to protect the joint from further injury. Any time any of these exercises cause you to have increased pain or swelling, decrease what you are doing until you are comfortable again and then slowly increase them. If you have problems or questions, call your caregiver or physical therapist for advice.   Rehabilitation is important following a joint replacement. After just a few days of immobilization, the muscles of the leg can become weakened and shrink (atrophy).  These exercises are designed to build up the tone and strength of the thigh and leg muscles and to improve motion. Often times heat used for twenty to thirty minutes before working out will loosen up your tissues and help with improving the range of motion but do not use heat for the first two weeks following surgery (sometimes heat can increase post-operative swelling).   These exercises can be done on a training (exercise) mat, on the floor, on a table or on a bed. Use whatever works the best and is most comfortable for you.  Use music or television while you are exercising so that the exercises are a pleasant break in your day. This will make your life better with the exercises acting as a break in your routine that you can look forward to.   Perform all exercises about fifteen times, three times per day or as directed.  You should exercise both the operative leg and the other leg as well.   Exercises include:   Quad Sets - Tighten up the muscle on the front of the thigh (Quad) and hold for 5-10 seconds.   Straight Leg Raises - With your knee straight (if you were given a brace, keep it on), lift the leg to 60 degrees, hold for 3 seconds, and slowly lower the leg.  Perform this exercise against resistance later  as your leg gets stronger.  Leg Slides: Lying on your back, slowly slide your foot toward your buttocks, bending your knee up off the floor (only go as far as is comfortable). Then slowly slide your foot back down until your leg is flat on the floor again.  Angel Wings: Lying on your back spread your legs to the side as far apart as you can without causing discomfort.  Hamstring Strength:  Lying on your back, push your heel against the floor with your leg straight by tightening up the muscles of your buttocks.  Repeat, but this time bend your knee to a comfortable angle, and push your heel against the floor.  You may put a pillow under the heel to make it more comfortable if necessary.   A rehabilitation program following joint replacement surgery can speed recovery and prevent re-injury in the future due to weakened muscles. Contact your doctor or a physical therapist for more information on knee rehabilitation.    CONSTIPATION  Constipation is defined medically as fewer than three stools per week and severe constipation as less than one stool per week.  Even if you have a regular bowel pattern at home, your normal regimen is likely to be disrupted due to multiple reasons following surgery.  Combination of anesthesia, postoperative narcotics, change in appetite and fluid intake all can affect your bowels.   YOU MUST use at least one of the following options; they are listed in order of increasing strength to get the job done.  They are all available over the counter, and you may need to use some, POSSIBLY even all of these options:    Drink plenty of fluids (prune juice may be helpful) and high fiber foods Colace 100 mg by mouth twice a day  Senokot for constipation as directed and as needed Dulcolax (bisacodyl), take with full glass of water  Miralax (polyethylene glycol) once or twice a day as needed.  If you have tried all these things and are unable to have a bowel movement in the first 3-4  days after surgery call either your surgeon or your primary doctor.    If you experience loose stools or diarrhea, hold the medications until you stool forms back up.  If your symptoms do not get better within 1 week or if they get worse, check with your doctor.  If you experience "the worst abdominal pain ever" or develop nausea or vomiting, please contact the office immediately for further recommendations for treatment.   ITCHING:  If you experience itching with your medications, try taking only a single pain pill, or even half a pain pill at a time.  You can also use Benadryl over the  counter for itching or also to help with sleep.   TED HOSE STOCKINGS:  Use stockings on both legs until for at least 2 weeks or as directed by physician office. They may be removed at night for sleeping.  MEDICATIONS:  See your medication summary on the "After Visit Summary" that nursing will review with you.  You may have some home medications which will be placed on hold until you complete the course of blood thinner medication.  It is important for you to complete the blood thinner medication as prescribed.  PRECAUTIONS:  If you experience chest pain or shortness of breath - call 911 immediately for transfer to the hospital emergency department.   If you develop a fever greater that 101 F, purulent drainage from wound, increased redness or drainage from wound, foul odor from the wound/dressing, or calf pain - CONTACT YOUR SURGEON.                                                   FOLLOW-UP APPOINTMENTS:  If you do not already have a post-op appointment, please call the office for an appointment to be seen by your surgeon.  Guidelines for how soon to be seen are listed in your "After Visit Summary", but are typically between 1-4 weeks after surgery.  OTHER INSTRUCTIONS:   Knee Replacement:  Do not place pillow under knee, focus on keeping the knee straight while resting. CPM instructions: 0-90 degrees, 2 hours  in the morning, 2 hours in the afternoon, and 2 hours in the evening. Place foam block, curve side up under heel at all times except when in CPM or when walking.  DO NOT modify, tear, cut, or change the foam block in any way.  MAKE SURE YOU:  Understand these instructions.  Get help right away if you are not doing well or get worse.    Thank you for letting us be a part of your medical care team.  It is a privilege we respect greatly.  We hope these instructions will help you stay on track for a fast and full recovery!   Driving restrictions    Complete by:  As directed    No driving for 6 weeks   Increase activity slowly as tolerated    Complete by:  As directed    Increase activity slowly as tolerated    Complete by:  As directed    Lifting restrictions    Complete by:  As directed    No lifting for 6 weeks   TED hose    Complete by:  As directed    Use stockings (TED hose) for 2 weeks on both leg(s).  You may remove them at night for sleeping.   Weight bearing as tolerated    Complete by:  As directed       Follow-up Information    Averly Ericson, Horald Pollen, MD. Schedule an appointment as soon as possible for a visit in 2 week(s).   Specialty:  Orthopedic Surgery Why:  For wound re-check Contact information: [place]. [address] [place] [address] [phone]        [place] .   Contact information: [address] [place] [address] [phone]            Signed: Elie Goody 11/18/2015, 7:41 AM

## 2015-11-15 NOTE — Transfer of Care (Signed)
Immediate Anesthesia Transfer of Care Note  Patient: Jennifer Stevens  Procedure(s) Performed: Procedure(s): TOTAL HIP ARTHROPLASTY ANTERIOR APPROACH (Right)  Patient Location: PACU  Anesthesia Type:General  Level of Consciousness: awake, alert , oriented and patient cooperative  Airway & Oxygen Therapy: Patient Spontanous Breathing and Patient connected to nasal cannula oxygen  Post-op Assessment: Report given to RN and Post -op Vital signs reviewed and stable  Post vital signs: Reviewed and stable  Last Vitals:  Vitals:   11/15/15 0505 11/15/15 1027  BP: 98/61 132/81  Pulse: 74 82  Resp: 16 18  Temp: 37 C 36.4 C    Last Pain:  Vitals:   11/15/15 0505  TempSrc: Oral  PainSc:       Patients Stated Pain Goal: 3 (00/92/33 0076)  Complications: No apparent anesthesia complications

## 2015-11-15 NOTE — H&P (View-Only) (Signed)
ORTHOPAEDIC CONSULTATION  REQUESTING PHYSICIAN: Waldemar Dickens, MD  PCP:  No primary care provider on file.  Chief Complaint: Right hip   HPI: Jennifer Stevens is a 60 y.o. female who complains of  Right hip pain following a fall on Sunday and difficulty weight bearing.  She presented to our ED with ER deformity and shortening.  Xrays confirmed right intracapsular fracture.  She is on permanent disability, with chrionic LBP and PTSD, but does ambulate without assistive devices at baseline.  Past Medical History:  Diagnosis Date  . Anxiety   . PTSD (post-traumatic stress disorder)   . Seizures ([place])    Past Surgical History:  Procedure Laterality Date  . BACK SURGERY    . CESAREAN SECTION    . NECK SURGERY     Social History   Social History  . Marital status: Single    Spouse name: N/A  . Number of children: N/A  . Years of education: N/A   Social History Main Topics  . Smoking status: Current Every Day Smoker    Packs/day: 1.00    Types: Cigarettes  . Smokeless tobacco: Never Used  . Alcohol use No  . Drug use: No  . Sexual activity: Not Asked   Other Topics Concern  . None   Social History Narrative  . None   History reviewed. No pertinent family history. Allergies  Allergen Reactions  . Nsaids     "stomach problems"   Prior to Admission medications   Medication Sig Start Date End Date Taking? Authorizing Provider  albuterol (PROVENTIL HFA;VENTOLIN HFA) 108 (90 Base) MCG/ACT inhaler Inhale 1-2 puffs into the lungs every 6 (six) hours as needed for wheezing or shortness of breath.   Yes Historical Provider, MD  ALPRAZolam Duanne Moron) 1 MG tablet Take 1 mg by mouth 3 (three) times daily.   Yes Historical Provider, MD  amphetamine-dextroamphetamine (ADDERALL) 20 MG tablet Take 20 mg by mouth daily.   Yes Historical Provider, MD  gabapentin (NEURONTIN) 300 MG capsule Take 300 mg by mouth 3 (three) times daily.   Yes Historical Provider, MD    HYDROcodone-acetaminophen (NORCO) 10-325 MG tablet Take 1-2 tablets by mouth every 6 (six) hours as needed for moderate pain.   Yes Historical Provider, MD  QUEtiapine (SEROQUEL) 100 MG tablet Take 100 mg by mouth 2 (two) times daily.   Yes Historical Provider, MD  QUEtiapine (SEROQUEL) 300 MG tablet Take 300 mg by mouth 2 (two) times daily.   Yes Historical Provider, MD  tiZANidine (ZANAFLEX) 4 MG tablet Take 4 mg by mouth every 6 (six) hours as needed for muscle spasms.   Yes Historical Provider, MD  traZODone (DESYREL) 150 MG tablet Take 300 mg by mouth at bedtime.   Yes Historical Provider, MD   Dg Hip Unilat  With Pelvis 2-3 Views Right  Result Date: 11/13/2015 CLINICAL DATA:  Status post fall 11/09/2012 with onset of right hip pain. Initial encounter. EXAM: DG HIP (WITH OR WITHOUT PELVIS) 2-3V RIGHT COMPARISON:  None. FINDINGS: There is a subcapital right hip fracture. The fracture is age indeterminate. There appears to be osteolysis of the inferior portion of the femoral head and proximal femoral neck. No dislocation. No other bony or joint abnormality. IMPRESSION: Subcapital right hip fracture cannot be definitively characterized but appears subacute to remote. Electronically Signed   By: Inge Rise M.D.   On: 11/13/2015 13:26   Dg Femur Min 2 Views Right  Result Date: 11/13/2015 CLINICAL DATA:  Right upper leg pain since a fall 11/10/2015. Initial encounter. EXAM: RIGHT FEMUR 2 VIEWS COMPARISON:  Plain films right hip this same day. FINDINGS: Right femoral neck fracture is identified. No other bony or joint abnormality is seen. IMPRESSION: Right femoral neck fracture as seen on the comparison examination. The study is otherwise negative. Electronically Signed   By: Inge Rise M.D.   On: 11/13/2015 13:27    Positive ROS: All other systems have been reviewed and were otherwise negative with the exception of those mentioned in the HPI and as above.  Physical Exam: General:  Alert, no acute distress Cardiovascular: No pedal edema Respiratory: No cyanosis, no use of accessory musculature GI: No organomegaly, abdomen is soft and non-tender Skin: No lesions in the area of chief complaint Neurologic: Sensation intact distally Psychiatric: Patient is competent for consent with normal mood and affect Lymphatic: No axillary or cervical lymphadenopathy  MUSCULOSKELETAL:  RLE- leg is shortened and ER +SILT sp/dp/sur/saph, 2+ DP , +TA/GSC/FHL/EHL  xrays-   EXAM: DG HIP (WITH OR WITHOUT PELVIS) 2-3V RIGHT  COMPARISON:  None.  FINDINGS: There is a subcapital right hip fracture. The fracture is age indeterminate. There appears to be osteolysis of the inferior portion of the femoral head and proximal femoral neck. No dislocation. No other bony or joint abnormality.  IMPRESSION: Subcapital right hip fracture cannot be definitively characterized but appears subacute to remote. Assessment: Right hip intracapsular fracture, requiring surgery.  Plan: -NWB -NPO -pain control -will plan for surgery tonight with total hip replacement -PT/OT post op    Nicholes Stairs, MD Cell [phone]    11/13/2015 5:05 PM

## 2015-11-16 LAB — BASIC METABOLIC PANEL
Anion gap: 6 (ref 5–15)
CHLORIDE: 106 mmol/L (ref 101–111)
CO2: 24 mmol/L (ref 22–32)
CREATININE: 0.5 mg/dL (ref 0.44–1.00)
Calcium: 7.2 mg/dL — ABNORMAL LOW (ref 8.9–10.3)
GFR calc Af Amer: >60 mL/min (ref >60)
GFR calc non Af Amer: >60 mL/min (ref >60)
GLUCOSE: 152 mg/dL — AB (ref 65–99)
POTASSIUM: 3.1 mmol/L — AB (ref 3.5–5.1)
SODIUM: 136 mmol/L (ref 135–145)

## 2015-11-16 LAB — CBC
HCT: 24.4 % — ABNORMAL LOW (ref 36.0–46.0)
HEMOGLOBIN: 8.2 g/dL — AB (ref 12.0–15.0)
MCH: 32 pg (ref 26.0–34.0)
MCHC: 33.6 g/dL (ref 30.0–36.0)
MCV: 95.3 fL (ref 78.0–100.0)
Platelets: 229 10*3/uL (ref 150–400)
RBC: 2.56 MIL/uL — ABNORMAL LOW (ref 3.87–5.11)
RDW: 13.5 % (ref 11.5–15.5)
WBC: 5.2 10*3/uL (ref 4.0–10.5)

## 2015-11-16 MED ORDER — NALOXONE HCL 0.4 MG/ML IJ SOLN
INTRAMUSCULAR | Status: AC
  Filled 2015-11-16: qty 1

## 2015-11-16 MED ORDER — NALOXONE HCL 0.4 MG/ML IJ SOLN
0.4000 mg | INTRAMUSCULAR | Status: DC | PRN
  Administered 2015-11-16: 0.2 mg via INTRAVENOUS
  Administered 2015-11-16: 0.4 mg via INTRAVENOUS

## 2015-11-16 MED ORDER — ACETAMINOPHEN 325 MG PO TABS
650.0000 mg | ORAL_TABLET | Freq: Once | ORAL | Status: AC
  Administered 2015-11-16: 650 mg via ORAL

## 2015-11-16 MED ORDER — SODIUM CHLORIDE 0.9 % IV BOLUS (SEPSIS): 500.0000 mL | Freq: Once | INTRAVENOUS | Status: DC | PRN

## 2015-11-16 MED ORDER — POTASSIUM CHLORIDE CRYS ER 20 MEQ PO TBCR
40.0000 meq | EXTENDED_RELEASE_TABLET | Freq: Once | ORAL | Status: AC
  Administered 2015-11-16: 40 meq via ORAL
  Filled 2015-11-16: qty 2

## 2015-11-16 MED ORDER — SODIUM CHLORIDE 0.9 % IV BOLUS (SEPSIS): 250.0000 mL | Freq: Once | INTRAVENOUS | Status: DC | PRN

## 2015-11-16 MED ORDER — SODIUM CHLORIDE 0.9 % IV BOLUS (SEPSIS): 500.0000 mL | Freq: Once | INTRAVENOUS | Status: DC

## 2015-11-16 NOTE — Care Management Note (Signed)
Case Management Note  Patient Details  Name: TAMERIA PATTI MRN: [id number] Date of Birth: March 11, 1955  Subjective/Objective: 60 yo F s/p R THA anterior approach                 Action/Plan: Received referral to assist with [facility]   Expected Discharge Date:                  Expected Discharge Plan:     In-House Referral:     Discharge planning Services  CM Consult  Post Acute Care Choice:    Choice offered to:     DME Arranged:    DME Agency:     HH Arranged:    HH Agency:     Status of Service:  In process, will continue to follow  If discussed at Long Length of Stay Meetings, dates discussed:    Additional Comments: awaiting for PT to evaluate pt. Pt refused PT tx this morning due to pain. Will continue to f/u to assist with the d/c plan.  Norina Buzzard, RN 11/16/2015, 2:41 PM

## 2015-11-16 NOTE — Progress Notes (Signed)
RN called for lethargy. On arrival pt lethargic, easily awakened with verbal stimuli, pupils pinpoint, Narcan 0.4 mg IVp administered. SBP mid 80's. 500 NS bolus given. Pt more alert, BP remains soft. MD paged, new order for additional Bolus given, completed by RN. Plan to continue to monitor pt, hand off given to [facility]

## 2015-11-16 NOTE — Progress Notes (Signed)
   Subjective: 1 Day Post-Op Procedure(s) (LRB): TOTAL HIP ARTHROPLASTY ANTERIOR APPROACH (Right) Patient reports pain as moderate.   Patient seen in rounds with Dr. Gladstone Lighter for Dr. Lyla Glassing. Patient is well, and has had no acute complaints or problems other than pain in the right hip. She denies SOB and chest pain. Reports desire to "get out of here". Issues overnight with over-sedation.    Objective: Vital signs in last 24 hours: Temp:  [97.5 F (36.4 C)-103.4 F (39.7 C)] 99.7 F (37.6 C) (11/11 0656) Pulse Rate:  [77-96] 96 (11/11 0039) Resp:  [10-18] 15 (11/11 0039) BP: (77-142)/(49-83) 91/64 (11/11 0656) SpO2:  [88 %-100 %] 93 % (11/11 0353)  Intake/Output from previous day:  Intake/Output Summary (Last 24 hours) at 11/16/15 0803 Last data filed at 11/15/15 1031  Gross per 24 hour  Intake             1410 ml  Output              250 ml  Net             1160 ml     Labs:  Recent Labs  11/13/15 1336 11/14/15 0528 11/15/15 0153 11/16/15 0434  HGB 12.1 12.6 10.8* 8.2*    Recent Labs  11/15/15 0153 11/16/15 0434  WBC 4.5 5.2  RBC 3.44* 2.56*  HCT 32.3* 24.4*  PLT 314 229    Recent Labs  11/15/15 0153 11/16/15 0434  NA 136 136  K 3.2* 3.1*  CL 106 106  CO2 23 24  BUN <5* <5*  CREATININE 0.50 0.50  GLUCOSE 90 152*  CALCIUM 8.1* 7.2*    Recent Labs  11/13/15 1845  INR 1.03    EXAM General - Patient is Alert and Oriented Extremity - Neurologically intact Intact pulses distally Dorsiflexion/Plantar flexion intact No cellulitis present Compartment soft Dressing/Incision - clean, dry, no drainage Motor Function - intact, moving foot and toes well on exam.   Past Medical History:  Diagnosis Date  . Anxiety   . PTSD (post-traumatic stress disorder)   . Seizure in infant [facility])    suspect febrile seizure. Pt unsure of history.     Assessment/Plan: 1 Day Post-Op Procedure(s) (LRB): TOTAL HIP ARTHROPLASTY ANTERIOR APPROACH (Right) Active  Problems:   Closed right hip fracture (H[facility])   Seizures (H[facility])   PTSD (post-traumatic stress disorder)   Anxiety   Tobacco use disorder   Closed fracture of right hip ([place])   Fall   Preop examination   Closed displaced fracture of right femoral neck (H[facility])  There is no height or weight on file to calculate BMI. Advance diet Up with therapy  DVT ProphylaxisEliquis Weight-Bearing as tolerated   Patient will work with PT today. Continue to monitor patient closely for sedation. DC when medically stable.   Ardeen Jourdain, PA-C Orthopaedic Surgery 11/16/2015, 8:03 AM

## 2015-11-16 NOTE — Progress Notes (Signed)
Physical Therapy Evaluation Patient Details Name: Jennifer Stevens MRN: [id number] DOB: 1955/09/27 Today's Date: 11/16/2015   History of Present Illness  Patient presents with femoral head fracture which  lead to a right total hip replacement anterior. She was reluctant at first to work with therapy 2nd to pain but she decided to try. She fell going down the astairs at her house. She reports her left knee frequently buckles on her. She reports frequnet back, neck knee, and hip pain.  Pertinent PMH. PTSD, anxiety  Clinical Impression  Patient was willing to work with therapy in the afternoon. She moved well despite reporting pain prior to therapy. She was able to get to the edge of the bed with supervision and able to take steps to the commode with assist. She only was able to walk 6 feet at this time. It is unclear if her supervision will be 24 hours or not. She would benefit from home health vs rehab at a SNF if she can have 24 hour assistance. She remains a high fall risk.     Follow Up Recommendations Home health PT;SNF    Equipment Recommendations  Standard walker;3in1 (PT)    Recommendations for Other Services       Precautions / Restrictions Precautions Precautions: Fall Precaution Comments: frequent falls 2nd to her left knee  Restrictions Weight Bearing Restrictions: No RLE Weight Bearing: Weight bearing as tolerated      Mobility  Bed Mobility Overal bed mobility: Needs Assistance Bed Mobility: Supine to Sit     Supine to sit: Supervision     General bed mobility comments: Patient was able to sit up with supervision and minimal verbal cues for foot placement. No increase in pain with bed mobility `  Transfers Overall transfer level: Needs assistance Equipment used: Rolling walker (2 wheeled) Transfers: Sit to/from Stand Sit to Stand: Min guard         General transfer comment: Min guard for balance. cuing to slide right foot out when sitting    Ambulation/Gait Ambulation/Gait assistance: Min guard Ambulation Distance (Feet): 6 Feet Assistive device: Rolling walker (2 wheeled) Gait Pattern/deviations: Step-through pattern Gait velocity: decreased   General Gait Details: Patient likely could have walked furter but declined. She agreed to walk to the commode today and that was it.   Stairs            Wheelchair Mobility    Modified Rankin (Stroke Patients Only)       Balance Overall balance assessment: Needs assistance Sitting-balance support: No upper extremity supported Sitting balance-Leahy Scale: Good     Standing balance support: Bilateral upper extremity supported Standing balance-Leahy Scale: Poor                               Pertinent Vitals/Pain Pain Assessment: 0-10 Pain Score: 8  Pain Location: right hip  Pain Descriptors / Indicators: Aching;Burning;Constant Pain Intervention(s): Monitored during session;Limited activity within patient's tolerance;Premedicated before session;Repositioned;Patient requesting pain meds-RN notified;Relaxation;Utilized relaxation techniques    Home Living Family/patient expects to be discharged to:: Private residence Living Arrangements: Other relatives (unspeciified family ) Available Help at Discharge: Family Type of Home: House Home Access: Stairs to enter;Ramped entrance Entrance Stairs-Rails: Right;Left Entrance Stairs-Number of Steps: 5 stpes to the porch      Additional Comments: Patient reports a ramp and steps to get into the house. At first she had reported just steps.      Prior  Function Level of Independence: Independent         Comments: Independnet but had frequent falls.     Hand Dominance   Dominant Hand: Right    Extremity/Trunk Assessment   Upper Extremity Assessment: Defer to OT evaluation           Lower Extremity Assessment: RLE deficits/detail         Communication   Communication: No difficulties   Cognition Arousal/Alertness: Awake/alert Behavior During Therapy: Anxious Overall Cognitive Status: Within Functional Limits for tasks assessed                      General Comments      Exercises     Assessment/Plan    PT Assessment Patient needs continued PT services  PT Problem List Decreased range of motion;Decreased strength;Decreased activity tolerance;Decreased balance;Decreased mobility;Decreased coordination;Decreased knowledge of use of DME;Decreased safety awareness;Pain;Decreased knowledge of precautions          PT Treatment Interventions DME instruction;Gait training;Stair training;Functional mobility training;Therapeutic activities;Therapeutic exercise;Balance training;Neuromuscular re-education;Manual techniques;Modalities;Patient/family education    PT Goals (Current goals can be found in the Care Plan section)  Acute Rehab PT Goals Patient Stated Goal: to go home  PT Goal Formulation: With patient/family Time For Goal Achievement: 11/30/15 Potential to Achieve Goals: Good    Frequency 7X/week   Barriers to discharge   Patient states she will have help but will require 24 hour assistance depending on her progression     Co-evaluation               End of Session                 Time: 1400-1430 PT Time Calculation (min) (ACUTE ONLY): 30 min   Charges:   PT Evaluation $PT Eval Low Complexity: 1 Procedure     PT G Codes:        Carney Living PT DPT  11/16/2015, 3:34 PM

## 2015-11-16 NOTE — Progress Notes (Signed)
Rapid response nurse was called for pt becoming lethargic, mildly arousable and drifts off to sleep during conversation. O2 sat to 84% and blood pressure of 88/49, pt also still febrile of 100.8 after dose of tylenol was given. Narcan 0.4 mg IV was given and 500 cc bolus of fluids was given also. Pt's blood pressure still low with 83/51. [facility] called and informed about pt's situation. Another 500 cc bolus of fluids was ordered. Recent BP 90/61, pt more reactive and arousable. Will continue to monitor.

## 2015-11-16 NOTE — Therapy (Signed)
[place] [facility] [place]    , [place],   Phone:     Fax:     Patient Details  Name: Jennifer Stevens MRN: [id number] Date of Birth: Jul 03, 1955 Referring Provider:  No ref. provider found  Encounter Date: 11/13/2015   Pt refused treatment in the morning. She reports she had been up to the commode earlier and was in too much pain. Therapy educated her on the importance of getting up out of bed and too a chair. Therapy also offered mobility tips to reduce pain with transfers but the patient continued to decline. Therapy will return in the afternoon for initial evaluation if time permits.   Carney Living 11/16/2015, 11:34 AM  [place]    , [place],   Phone:     Fax:

## 2015-11-16 NOTE — Progress Notes (Signed)
Patient ID: Jennifer Stevens, female   DOB: [birth year], 60 y.o.   MRN: [id number]    PROGRESS NOTE    Jennifer Stevens  IHK:[id number] DOB: [birth year] DOA: 11/13/2015  PCP: No primary care provider on file.   Brief Narrative:  60 y.o. female with medical history significant of anxiety, PTSD, Szrs presenting w/ 3 day history of right hip pain. Fall sustained after left knee gave out. 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 Patient's right hip struck the floor. Immediately painful.   Assessment & Plan:  Right hip intracapsular fracture, requiring surgery. - 1 Day Post-Op Procedure(s) (LRB), TOTAL HIP ARTHROPLASTY ANTERIOR APPROACH (Right) - still with moderate pain  - plan on PT eval today and based on eval, will determined appropriate d/c planning     Seizures ([place]) - stable for now    Hypokalemia - supplement and repeat BMP in AM    Post op blood loss anemia - drop in Hg: 12 --> 10 --> 8 - monitor closely, no indication for transfusion at this time - no signs of active bleeding - CBC in AM   DVT prophylaxis: Lovenox SQ Code Status: Full Family Communication: Patient at bedside  Disposition Plan: To be determined based on PT eval   Consultants:   Ortho  PT   Procedures:   Right hip repair 2/10   Antimicrobials:   Pre op ABX   Subjective: Still with right hip pain.  Objective: Vitals:   11/16/15 0430 11/16/15 0630 11/16/15 0656 11/16/15 0906  BP: (!) 83/51 (!) 85/59 91/64 100/65  Pulse:    86  Resp:    15  Temp: (!) 100.8 F (38.2 C)  99.7 F (37.6 C) 98.8 F (37.1 C)  TempSrc: Rectal  Rectal Oral  SpO2:    100%   No intake or output data in the 24 hours ending 11/16/15 1137 There were no vitals filed for this visit.  Examination:  General exam: Appears calm and comfortable  Respiratory system: Clear to auscultation. Respiratory effort normal. Cardiovascular system: S1 & S2 heard, RRR. No JVD, murmurs, rubs, gallops or  clicks. No pedal edema. Gastrointestinal system: Abdomen is nondistended, soft and nontender. No organomegaly or masses felt. Normal bowel sounds  Data Reviewed: I have personally reviewed following labs and imaging studies  CBC:  Recent Labs Lab 11/13/15 1336 11/14/15 0528 11/15/15 0153 11/16/15 0434  WBC 6.0 3.9* 4.5 5.2  NEUTROABS 4.6  --   --   --   HGB 12.1 12.6 10.8* 8.2*  HCT 35.2* 37.5 32.3* 24.4*  MCV 93.1 93.8 93.9 95.3  PLT 272 329 314 756   Basic Metabolic Panel:  Recent Labs Lab 11/13/15 1336 11/13/15 1845 11/14/15 0528 11/15/15 0153 11/16/15 0434  NA 130*  --  133* 136 136  K 3.4*  --  3.5 3.2* 3.1*  CL 96*  --  103 106 106  CO2 26  --  '23 23 24  '$ GLUCOSE 113*  --  105* 90 152*  BUN 8  --  5* <5* <5*  CREATININE 0.66  --  0.60 0.50 0.50  CALCIUM 8.7*  --  8.4* 8.1* 7.2*  MG  --  2.1  --   --   --   PHOS  --  3.4  --   --   --    Coagulation Profile:  Recent Labs Lab 11/13/15 1845  INR 1.03   CBG:  Recent Labs Lab 11/13/15 1827 11/13/15  2105 11/14/15 0621  GLUCAP 104* 104* 112*   Recent Results (from the past 240 hour(s))  MRSA PCR Screening     Status: None   Collection Time: 11/13/15  6:00 PM  Result Value Ref Range Status   MRSA by PCR NEGATIVE NEGATIVE Final    Comment:        The GeneXpert MRSA Assay (FDA approved for NASAL specimens only), is one component of a comprehensive MRSA colonization surveillance program. It is not intended to diagnose MRSA infection nor to guide or monitor treatment for MRSA infections.       Radiology Studies: Pelvis Portable  Result Date: 11/15/2015 CLINICAL DATA:  Status post right hip replacement EXAM: PORTABLE PELVIS 1-2 VIEWS COMPARISON:  None. FINDINGS: Total right hip arthroplasty without periprosthetic fracture or malalignment in the frontal projection. Negative visualized pelvis and left hip. Prominent stool volume. IMPRESSION: New total right hip arthroplasty without adverse finding.  Electronically Signed   By: Monte Fantasia M.D.   On: 11/15/2015 10:52   Dg C-arm 61-120 Min  Result Date: 11/15/2015 CLINICAL DATA:  Right hip arthroplasty EXAM: DG C-ARM 61-120 MIN; OPERATIVE RIGHT HIP WITH PELVIS COMPARISON:  None FLUOROSCOPY TIME:  21 seconds FINDINGS: Interval right total hip arthroplasty without failure or complication. No obvious dislocation. IMPRESSION: Interval right total hip arthroplasty. Electronically Signed   By: Kathreen Devoid   On: 11/15/2015 10:19   Dg Hip Operative Unilat W Or W/o Pelvis Right  Result Date: 11/15/2015 CLINICAL DATA:  Right hip arthroplasty EXAM: DG C-ARM 61-120 MIN; OPERATIVE RIGHT HIP WITH PELVIS COMPARISON:  None FLUOROSCOPY TIME:  21 seconds FINDINGS: Interval right total hip arthroplasty without failure or complication. No obvious dislocation. IMPRESSION: Interval right total hip arthroplasty. Electronically Signed   By: Kathreen Devoid   On: 11/15/2015 10:19      Scheduled Meds: . ALPRAZolam  1 mg Oral TID  . amphetamine-dextroamphetamine  20 mg Oral Daily  . docusate sodium  100 mg Oral BID  . enoxaparin (LOVENOX) injection  40 mg Subcutaneous Q24H  . gabapentin  300 mg Oral TID  . naloxone      . QUEtiapine  100 mg Oral BID  . QUEtiapine  300 mg Oral BID  . senna  1 tablet Oral BID  . sodium chloride  500 mL Intravenous Once  . traZODone  300 mg Oral QHS   Continuous Infusions: . sodium chloride 100 mL/hr at 11/15/15 1145     LOS: 3 days    Time spent: 20 minutes    Faye Ramsay, MD [facility] Pager [phone]  If 7PM-7AM, please contact night-coverage [email or web address] Password [facility] 11/16/2015, 11:37 AM

## 2015-11-17 ENCOUNTER — Encounter (HOSPITAL_COMMUNITY): Payer: Self-pay | Admitting: Orthopedic Surgery

## 2015-11-17 LAB — BASIC METABOLIC PANEL
Anion gap: 7 (ref 5–15)
CHLORIDE: 105 mmol/L (ref 101–111)
CO2: 24 mmol/L (ref 22–32)
CREATININE: 0.43 mg/dL — AB (ref 0.44–1.00)
Calcium: 8 mg/dL — ABNORMAL LOW (ref 8.9–10.3)
GFR calc Af Amer: >60 mL/min (ref >60)
GFR calc non Af Amer: >60 mL/min (ref >60)
GLUCOSE: 114 mg/dL — AB (ref 65–99)
Potassium: 3.9 mmol/L (ref 3.5–5.1)
Sodium: 136 mmol/L (ref 135–145)

## 2015-11-17 LAB — CBC
HEMATOCRIT: 26.9 % — AB (ref 36.0–46.0)
HEMOGLOBIN: 8.9 g/dL — AB (ref 12.0–15.0)
MCH: 31.1 pg (ref 26.0–34.0)
MCHC: 33.1 g/dL (ref 30.0–36.0)
MCV: 94.1 fL (ref 78.0–100.0)
Platelets: 291 10*3/uL (ref 150–400)
RBC: 2.86 MIL/uL — ABNORMAL LOW (ref 3.87–5.11)
RDW: 13.2 % (ref 11.5–15.5)
WBC: 6.7 10*3/uL (ref 4.0–10.5)

## 2015-11-17 MED ORDER — SENNA 8.6 MG PO TABS: 1.0000 | ORAL_TABLET | Freq: Two times a day (BID) | ORAL | 0 refills | Status: DC

## 2015-11-17 MED ORDER — POLYETHYLENE GLYCOL 3350 17 G PO PACK: 17.0000 g | PACK | Freq: Every day | ORAL | 0 refills | Status: DC | PRN

## 2015-11-17 NOTE — Progress Notes (Signed)
Patient ID: Jennifer Stevens, female   DOB: [birth year], 60 y.o.   MRN: [id number]    PROGRESS NOTE    Jennifer Stevens  XTK:[id number] DOB: 1955-03-15 DOA: 11/13/2015  PCP: No primary care provider on file.   Brief Narrative:  60 y.o. female with medical history significant of anxiety, PTSD, Szrs presenting w/ 3 day history of right hip pain. Fall sustained after left knee gave out. 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 Patient's right hip struck the floor. Immediately painful.   Assessment & Plan:  Right hip intracapsular fracture, requiring surgery. - 2 Days Post-Op Procedure(s) (LRB), TOTAL HIP ARTHROPLASTY ANTERIOR APPROACH (Right) - still with moderate pain  - plan on PT again today and if pt tolerating well, can likely be discharged this afternoon     Seizures ([place]) - stable for now    Hypokalemia - supplemented and WNL this AM     Post op blood loss anemia - drop in Hg: 12 --> 10 --> 8 --> 8.9 - no indication for transfusion at this time - no signs of active bleeding   DVT prophylaxis: Lovenox SQ Code Status: Full Family Communication: Patient at bedside  Disposition Plan: To be determined based on PT eval   Consultants:   Ortho  PT   Procedures:   Right hip repair 11/15/2015   Antimicrobials:   Pre op ABX  Subjective: Still with right hip pain but overall better.   Objective: Vitals:   11/16/15 0906 11/16/15 1351 11/16/15 2009 11/17/15 0402  BP: 100/65 (!) 126/92 (!) 108/58 116/71  Pulse: 86 94 88 89  Resp: '15 15 16 16  '$ Temp: 98.8 F (37.1 C) 100 F (37.8 C) 98.7 F (37.1 C) 99.5 F (37.5 C)  TempSrc: Oral Oral Oral Oral  SpO2: 100%  94% 91%   No intake or output data in the 24 hours ending 11/17/15 1212 There were no vitals filed for this visit.  Examination:  General exam: Appears calm and comfortable  Respiratory system: Clear to auscultation. Respiratory effort normal. Cardiovascular system: S1 & S2 heard,  RRR. No JVD, murmurs, rubs, gallops or clicks. No pedal edema. Gastrointestinal system: Abdomen is nondistended, soft and nontender. No organomegaly or masses felt. Normal bowel sounds  Data Reviewed: I have personally reviewed following labs and imaging studies  CBC:  Recent Labs Lab 11/13/15 1336 11/14/15 0528 11/15/15 0153 11/16/15 0434 11/17/15 0309  WBC 6.0 3.9* 4.5 5.2 6.7  NEUTROABS 4.6  --   --   --   --   HGB 12.1 12.6 10.8* 8.2* 8.9*  HCT 35.2* 37.5 32.3* 24.4* 26.9*  MCV 93.1 93.8 93.9 95.3 94.1  PLT 272 329 314 229 992   Basic Metabolic Panel:  Recent Labs Lab 11/13/15 1336 11/13/15 1845 11/14/15 0528 11/15/15 0153 11/16/15 0434 11/17/15 0309  NA 130*  --  133* 136 136 136  K 3.4*  --  3.5 3.2* 3.1* 3.9  CL 96*  --  103 106 106 105  CO2 26  --  '23 23 24 24  '$ GLUCOSE 113*  --  105* 90 152* 114*  BUN 8  --  5* <5* <5* <5*  CREATININE 0.66  --  0.60 0.50 0.50 0.43*  CALCIUM 8.7*  --  8.4* 8.1* 7.2* 8.0*  MG  --  2.1  --   --   --   --   PHOS  --  3.4  --   --   --   --  Coagulation Profile:  Recent Labs Lab 11/13/15 1845  INR 1.03   CBG:  Recent Labs Lab 11/13/15 1827 11/13/15 2105 11/14/15 0621  GLUCAP 104* 104* 112*   Recent Results (from the past 240 hour(s))  MRSA PCR Screening     Status: None   Collection Time: 11/13/15  6:00 PM  Result Value Ref Range Status   MRSA by PCR NEGATIVE NEGATIVE Final    Comment:        The GeneXpert MRSA Assay (FDA approved for NASAL specimens only), is one component of a comprehensive MRSA colonization surveillance program. It is not intended to diagnose MRSA infection nor to guide or monitor treatment for MRSA infections.       Radiology Studies: No results found.    Scheduled Meds: . ALPRAZolam  1 mg Oral TID  . amphetamine-dextroamphetamine  20 mg Oral Daily  . docusate sodium  100 mg Oral BID  . enoxaparin (LOVENOX) injection  40 mg Subcutaneous Q24H  . gabapentin  300 mg Oral TID   . QUEtiapine  100 mg Oral BID  . QUEtiapine  300 mg Oral BID  . senna  1 tablet Oral BID  . sodium chloride  500 mL Intravenous Once  . traZODone  300 mg Oral QHS   Continuous Infusions:    LOS: 4 days    Time spent: 20 minutes    Faye Ramsay, MD [facility] Pager [phone]  If 7PM-7AM, please contact night-coverage [email or web address] Password [email or web address] 11/17/2015, 12:12 PM

## 2015-11-17 NOTE — Care Management Note (Addendum)
Case Management Note  Patient Details  Name: Jennifer Stevens MRN: [id number] Date of Birth: 1955-03-23  Subjective/Objective:                  s/p R THA anterior approach             Action/Plan: CM spoke with patient at the bedside. Reports she will have assistance at home from a friend when she is discharged. She is attempting to reach someone for transportation home to [place] if she is discharged today. States she will stay another night if she is unable to secure transportation. CM explained if she is discharged today, the hospital cannot keep her for another night due to transportation needs. Reggie at [facility] notified of the standard walker and 3N1 order and will deliver DME to patient's room today. She has selected [facility] for home health services. CM spoke with [facility] in [place]. They are unable to accept additional patients with [facility] Medicare at this time. [facility] is able to accept the patient and she agrees to [facility] for HHPT. Jermaine at [facility] notified of the referral.    Expected Discharge Date:     11/17/2015             Expected Discharge Plan:  [place]  In-House Referral:     Discharge planning Services  CM Consult  Post Acute Care Choice:  Home Health Choice offered to:  Patient  DME Arranged:  3-N-1Gilford Rile DME Agency:  [place]:  PT [facility] Agency:  [place] Status of Service:  Completed, signed off  If discussed at [place] of Stay Meetings, dates discussed:    Additional Comments:  Apolonio Schneiders, RN 11/17/2015, 11:12 AM

## 2015-11-17 NOTE — Evaluation (Signed)
Occupational Therapy Evaluation Patient Details Name: Jennifer Stevens MRN: [id number] DOB: December 16, 1955 Today's Date: 11/17/2015    History of Present Illness Patient presents with femoral head fracture which  lead to a right total hip replacement. She was reluctant at first to work with therapy 2nd to pain but she decided to try. She fell going down the astairs at her house. She reports her left knee frequently buckles on her. She reports frequnet back, neck knee, and hip pain.  Pertinent PMH. PTSD, anxiety   Clinical Impression   Pt admitted with the above diagnoses and presents with below problem list. Pt will benefit from continued acute OT to address the below listed deficits and maximize independence with basic ADLs prior to d/c to venue below. PTA pt was independent with ADLs. Pt is currently min guard with LB ADLs and functional mobility, likely min A for tub shower transfers.       Follow Up Recommendations  Home health OT;Supervision/Assistance - 24 hour    Equipment Recommendations  3 in 1 bedside comode;Other (comment) (has been delivered to room)    Recommendations for Other Services       Precautions / Restrictions Precautions Precautions: Fall Precaution Comments: frequent falls 2nd to her left knee  Restrictions Weight Bearing Restrictions: No RLE Weight Bearing: Weight bearing as tolerated      Mobility Bed Mobility               General bed mobility comments: up in chair  Transfers Overall transfer level: Needs assistance Equipment used: Rolling walker (2 wheeled) Transfers: Sit to/from Stand Sit to Stand: Min guard         General transfer comment: Min guard for balance. cuing to slide right foot out when sitting     Balance Overall balance assessment: Needs assistance Sitting-balance support: No upper extremity supported;Feet supported Sitting balance-Leahy Scale: Good     Standing balance support: Bilateral upper extremity  supported;During functional activity Standing balance-Leahy Scale: Poor                              ADL Overall ADL's : Needs assistance/impaired Eating/Feeding: Set up;Sitting   Grooming: Set up;Sitting   Upper Body Bathing: Set up;Sitting   Lower Body Bathing: Min guard;Sit to/from stand   Upper Body Dressing : Set up;Sitting   Lower Body Dressing: Min guard;Sit to/from stand   Toilet Transfer: Min guard;Ambulation;RW   Toileting- Water quality scientist and Hygiene: Min guard;Sit to/from stand;Minimal assistance   Tub/ Shower Transfer: Minimal assistance;Tub transfer;Min guard;Ambulation;3 in 1;Rolling walker   Functional mobility during ADLs: Min guard;Rolling walker General ADL Comments: Pt ambulated to/from bathroom and completed toileting as detailed above. Educated on ADLs.      Vision     Perception     Praxis      Pertinent Vitals/Pain Pain Assessment: 0-10 Pain Score: 10-Worst pain ever Pain Location: R hip Pain Descriptors / Indicators: Aching;Burning Pain Intervention(s): Limited activity within patient's tolerance;Monitored during session;Repositioned     Hand Dominance Right   Extremity/Trunk Assessment Upper Extremity Assessment Upper Extremity Assessment: Overall WFL for tasks assessed   Lower Extremity Assessment Lower Extremity Assessment: Defer to PT evaluation       Communication Communication Communication: No difficulties   Cognition Arousal/Alertness: Awake/alert Behavior During Therapy: Anxious Overall Cognitive Status: Within Functional Limits for tasks assessed  General Comments       Exercises       Shoulder Instructions      Home Living Family/patient expects to be discharged to:: Private residence Living Arrangements: Other relatives Available Help at Discharge: Family Type of Home: House Home Access: Stairs to enter;Ramped entrance Entrance Stairs-Number of Steps: 5 stpes  to the porch  Entrance Stairs-Rails: Right;Left Home Layout: Two level;Able to live on main level with bedroom/bathroom     Bathroom Shower/Tub: Tub/shower unit;Curtain   Bathroom Toilet: Standard Bathroom Accessibility: Yes How Accessible: Accessible via walker     Additional Comments: Patient reports a ramp and steps to get into the house. At first she had reported just steps.        Prior Functioning/Environment Level of Independence: Independent        Comments: Independnet but had frequent falls.        OT Problem List: Impaired balance (sitting and/or standing);Decreased knowledge of use of DME or AE;Decreased knowledge of precautions;Pain   OT Treatment/Interventions: Self-care/ADL training;DME and/or AE instruction;Therapeutic activities;Patient/family education;Balance training    OT Goals(Current goals can be found in the care plan section) Acute Rehab OT Goals Patient Stated Goal: to go home  OT Goal Formulation: With patient Time For Goal Achievement: 11/24/15 Potential to Achieve Goals: Good ADL Goals Pt Will Perform Lower Body Bathing: with modified independence;sit to/from stand Pt Will Perform Lower Body Dressing: with modified independence;sit to/from stand Pt Will Transfer to Toilet: with modified independence;ambulating Pt Will Perform Toileting - Clothing Manipulation and hygiene: with modified independence;sit to/from stand Pt Will Perform Tub/Shower Transfer: with modified independence;ambulating;rolling walker;3 in 1  OT Frequency: Min 2X/week   Barriers to D/C:            Co-evaluation              End of Session Equipment Utilized During Treatment: Gait belt;Rolling walker  Activity Tolerance: Patient tolerated treatment well;Patient limited by pain Patient left: in bed;with call bell/phone within reach (declined SCD's)   Time: 7680-8811 OT Time Calculation (min): 32 min Charges:  OT General Charges $OT Visit: 1 Procedure OT  Evaluation $OT Eval Low Complexity: 1 Procedure OT Treatments $Self Care/Home Management : 8-22 mins G-Codes:    Hortencia Pilar 11/22/2015, 1:54 PM

## 2015-11-17 NOTE — Progress Notes (Signed)
Patient ID: Jennifer Stevens, female   DOB: 08/31/1955, 60 y.o.   MRN: [id number]  Subjective: 2 Days Post-Op Procedure(s) (LRB): TOTAL HIP ARTHROPLASTY ANTERIOR APPROACH (Right)    Patient reports pain as mild.  Up on potty chair this am.  Reviewed case from chart and with [facility]  Objective:   VITALS:   Vitals:   11/16/15 2009 11/17/15 0402  BP: (!) 108/58 116/71  Pulse: 88 89  Resp: 16 16  Temp: 98.7 F (37.1 C) 99.5 F (37.5 C)    Neurovascular intact Incision: dressing C/D/I  LABS  Recent Labs  11/15/15 0153 11/16/15 0434 11/17/15 0309  HGB 10.8* 8.2* 8.9*  HCT 32.3* 24.4* 26.9*  WBC 4.5 5.2 6.7  PLT 314 229 291     Recent Labs  11/15/15 0153 11/16/15 0434 11/17/15 0309  NA 136 136 136  K 3.2* 3.1* 3.9  BUN <5* <5* <5*  CREATININE 0.50 0.50 0.43*  GLUCOSE 90 152* 114*    No results for input(s): LABPT, INR in the last 72 hours.   Assessment/Plan: 2 Days Post-Op Procedure(s) (LRB): TOTAL HIP ARTHROPLASTY ANTERIOR APPROACH (Right)   Up with therapy  Home today if does well with therapy if not tomorrow

## 2015-11-17 NOTE — Progress Notes (Signed)
Physical Therapy Treatment Patient Details Name: Jennifer Stevens MRN: [id number] DOB: November 25, 1955 Today's Date: 11/17/2015    History of Present Illness Patient presents with femoral head fracture which  lead to a right total hip replacement. She was reluctant at first to work with therapy 2nd to pain but she decided to try. She fell going down the astairs at her house. She reports her left knee frequently buckles on her. She reports frequnet back, neck knee, and hip pain.  Pertinent PMH. PTSD, anxiety    PT Comments    Continuing work on functional mobility with the goal of being able to safely mobilize well enough to go home; Discussed case with RN, and we agree pt will benefit from another night in hospital and 1-2 more sessions of therapies prior to dc; able to walk much further today than last session with minguard/supervision for safety  Follow Up Recommendations  Home health PT;Supervision/Assistance - 24 hour (pt tells me she has 24 hour assist; would like this verified)     Equipment Recommendations  Rolling walker with 5" wheels;3in1 (PT)    Recommendations for Other Services       Precautions / Restrictions Precautions Precautions: Fall Precaution Comments: frequent falls which she attributes to an unstable L knee Restrictions Weight Bearing Restrictions: No RLE Weight Bearing: Weight bearing as tolerated    Mobility  Bed Mobility Overal bed mobility: Needs Assistance Bed Mobility: Supine to Sit     Supine to sit: Supervision     General bed mobility comments: Cues for technique  Transfers Overall transfer level: Needs assistance Equipment used: Rolling walker (2 wheeled) Transfers: Sit to/from Stand Sit to Stand: Min guard         General transfer comment: Cues for hand placement and safety  Ambulation/Gait Ambulation/Gait assistance: Min guard (with and without physical contact) Ambulation Distance (Feet): 80 Feet Assistive device: Rolling walker  (2 wheeled) Gait Pattern/deviations: Step-through pattern;Trunk flexed     General Gait Details: Cues for gait sequence and upright posture; cues also to self-monitor for activity tolerance   Stairs            Wheelchair Mobility    Modified Rankin (Stroke Patients Only)       Balance Overall balance assessment: Needs assistance Sitting-balance support: No upper extremity supported;Feet supported Sitting balance-Leahy Scale: Good     Standing balance support: Bilateral upper extremity supported;During functional activity Standing balance-Leahy Scale: Poor                      Cognition Arousal/Alertness: Awake/alert Behavior During Therapy: WFL for tasks assessed/performed Overall Cognitive Status: Within Functional Limits for tasks assessed                      Exercises      General Comments        Pertinent Vitals/Pain Pain Assessment: 0-10 Pain Score: 7  Pain Location: R hip Pain Descriptors / Indicators: Aching Pain Intervention(s): Monitored during session    Home Living Family/patient expects to be discharged to:: Private residence Living Arrangements: Other relatives Available Help at Discharge: Family Type of Home: House Home Access: Stairs to enter;Ramped entrance Entrance Stairs-Rails: Right;Left Home Layout: Two level;Able to live on main level with bedroom/bathroom   Additional Comments: Patient reports a ramp and steps to get into the house. At first she had reported just steps.      Prior Function Level of Independence: Independent  Comments: Independnet but had frequent falls.   PT Goals (current goals can now be found in the care plan section) Acute Rehab PT Goals Patient Stated Goal: to go home  PT Goal Formulation: With patient/family Time For Goal Achievement: 11/30/15 Potential to Achieve Goals: Good Progress towards PT goals: Progressing toward goals    Frequency    7X/week      PT Plan  Current plan remains appropriate    Co-evaluation             End of Session Equipment Utilized During Treatment: Gait belt Activity Tolerance: Patient tolerated treatment well Patient left: in chair;with call bell/phone within reach;with chair alarm set     Time: 2482-5003 PT Time Calculation (min) (ACUTE ONLY): 28 min  Charges:  $Gait Training: 23-37 mins $Therapeutic Activity: 8-22 mins                    G Codes:      Quin Hoop 11/17/2015, 3:19 PM  Roney Marion, [place] Pager [phone] Office [phone]

## 2015-11-18 LAB — CBC
HEMATOCRIT: 25.4 % — AB (ref 36.0–46.0)
Hemoglobin: 8.4 g/dL — ABNORMAL LOW (ref 12.0–15.0)
MCH: 31.2 pg (ref 26.0–34.0)
MCHC: 33.1 g/dL (ref 30.0–36.0)
MCV: 94.4 fL (ref 78.0–100.0)
PLATELETS: 316 10*3/uL (ref 150–400)
RBC: 2.69 MIL/uL — ABNORMAL LOW (ref 3.87–5.11)
RDW: 13.3 % (ref 11.5–15.5)
WBC: 5.6 10*3/uL (ref 4.0–10.5)

## 2015-11-18 LAB — GLUCOSE, CAPILLARY
Glucose-Capillary: 124 mg/dL — ABNORMAL HIGH (ref 65–99)
Glucose-Capillary: 102 mg/dL — ABNORMAL HIGH (ref 65–99)

## 2015-11-18 NOTE — Progress Notes (Signed)
Patient ID: Jennifer Stevens, female   DOB: 03-28-1955, 60 y.o.   MRN: [id number]    PROGRESS NOTE    Jennifer Stevens  PIR:[id number] DOB: March 27, 1955 DOA: 11/13/2015  PCP: No primary care provider on file.   Brief Narrative:  60 y.o. female with medical history significant of anxiety, PTSD, Szrs presenting w/ 3 day history of right hip pain. Fall sustained after left knee gave out. 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 Patient's right hip struck the floor. Immediately painful.   Assessment & Plan:  Right hip intracapsular fracture, requiring surgery. - 3 Days Post-Op Procedure(s) (LRB), TOTAL HIP ARTHROPLASTY ANTERIOR APPROACH (Right) - HH PT, home today    Seizures ([place]) - stable for now    Hypokalemia - supplemented     Post op blood loss anemia - drop in Hg: 12 --> 10 --> 8 --> 8.4 - no indication for transfusion at this time - no signs of active bleeding  DVT prophylaxis: Lovenox SQ Code Status: Full Family Communication: Patient at bedside  Disposition Plan: Home  Consultants:   Ortho  PT   Procedures:   Right hip repair 11/15/2015   Antimicrobials:   Pre op ABX  Subjective: Still with right hip pain but overall better.   Objective: Vitals:   11/17/15 0402 11/17/15 1403 11/17/15 2049 11/18/15 0536  BP: 116/71 (!) 90/46 (!) 94/55 93/74  Pulse: 89 92 88 86  Resp: '16 18 16 16  '$ Temp: 99.5 F (37.5 C) 97.9 F (36.6 C) 97.9 F (36.6 C) 98.7 F (37.1 C)  TempSrc: Oral  Oral Oral  SpO2: 91% 98% 93% 96%   No intake or output data in the 24 hours ending 11/18/15 1024 There were no vitals filed for this visit.  Examination:  General exam: Appears calm and comfortable  Respiratory system: Clear to auscultation. Respiratory effort normal. Cardiovascular system: S1 & S2 heard, RRR. No JVD, murmurs, rubs, gallops or clicks. No pedal edema. Gastrointestinal system: Abdomen is nondistended, soft and nontender. No organomegaly or  masses felt. Normal bowel sounds  Data Reviewed: I have personally reviewed following labs and imaging studies  CBC:  Recent Labs Lab 11/13/15 1336 11/14/15 0528 11/15/15 0153 11/16/15 0434 11/17/15 0309 11/18/15 0517  WBC 6.0 3.9* 4.5 5.2 6.7 5.6  NEUTROABS 4.6  --   --   --   --   --   HGB 12.1 12.6 10.8* 8.2* 8.9* 8.4*  HCT 35.2* 37.5 32.3* 24.4* 26.9* 25.4*  MCV 93.1 93.8 93.9 95.3 94.1 94.4  PLT 272 329 314 229 291 630   Basic Metabolic Panel:  Recent Labs Lab 11/13/15 1336 11/13/15 1845 11/14/15 0528 11/15/15 0153 11/16/15 0434 11/17/15 0309  NA 130*  --  133* 136 136 136  K 3.4*  --  3.5 3.2* 3.1* 3.9  CL 96*  --  103 106 106 105  CO2 26  --  '23 23 24 24  '$ GLUCOSE 113*  --  105* 90 152* 114*  BUN 8  --  5* <5* <5* <5*  CREATININE 0.66  --  0.60 0.50 0.50 0.43*  CALCIUM 8.7*  --  8.4* 8.1* 7.2* 8.0*  MG  --  2.1  --   --   --   --   PHOS  --  3.4  --   --   --   --    Coagulation Profile:  Recent Labs Lab 11/13/15 1845  INR 1.03  CBG:  Recent Labs Lab 11/13/15 1827 11/13/15 2105 11/14/15 0621 11/18/15 0832  GLUCAP 104* 104* 112* 124*   Recent Results (from the past 240 hour(s))  MRSA PCR Screening     Status: None   Collection Time: 11/13/15  6:00 PM  Result Value Ref Range Status   MRSA by PCR NEGATIVE NEGATIVE Final    Comment:        The GeneXpert MRSA Assay (FDA approved for NASAL specimens only), is one component of a comprehensive MRSA colonization surveillance program. It is not intended to diagnose MRSA infection nor to guide or monitor treatment for MRSA infections.       Radiology Studies: No results found.    Scheduled Meds: . ALPRAZolam  1 mg Oral TID  . amphetamine-dextroamphetamine  20 mg Oral Daily  . docusate sodium  100 mg Oral BID  . enoxaparin (LOVENOX) injection  40 mg Subcutaneous Q24H  . gabapentin  300 mg Oral TID  . QUEtiapine  100 mg Oral BID  . QUEtiapine  300 mg Oral BID  . senna  1 tablet Oral  BID  . sodium chloride  500 mL Intravenous Once  . traZODone  300 mg Oral QHS   Continuous Infusions:    LOS: 5 days    Time spent: 20 minutes    Faye Ramsay, MD [facility] Pager [phone]  If 7PM-7AM, please contact night-coverage [email or web address] Password [email or web address] 11/18/2015, 10:24 AM

## 2015-11-18 NOTE — Care Management Important Message (Signed)
Important Message  Patient Details  Name: Jennifer Stevens MRN: [id number] Date of Birth: 16-Oct-1955   Medicare Important Message Given:  Yes    Courtany Mcmurphy 11/18/2015, 11:24 AM

## 2015-11-18 NOTE — Progress Notes (Signed)
   Subjective:  Patient reports pain as mild to moderate.  Wants to go home.  Objective:   VITALS:   Vitals:   11/17/15 0402 11/17/15 1403 11/17/15 2049 11/18/15 0536  BP: 116/71 (!) 90/46 (!) 94/55 93/74  Pulse: 89 92 88 86  Resp: '16 18 16 16  '$ Temp: 99.5 F (37.5 C) 97.9 F (36.6 C) 97.9 F (36.6 C) 98.7 F (37.1 C)  TempSrc: Oral  Oral Oral  SpO2: 91% 98% 93% 96%    NAD ABD soft Sensation intact distally Intact pulses distally Dorsiflexion/Plantar flexion intact Incision: dressing C/D/I Compartment soft   Lab Results  Component Value Date   WBC 5.6 11/18/2015   HGB 8.4 (L) 11/18/2015   HCT 25.4 (L) 11/18/2015   MCV 94.4 11/18/2015   PLT 316 11/18/2015   BMET    Component Value Date/Time   NA 136 11/17/2015 0309   K 3.9 11/17/2015 0309   CL 105 11/17/2015 0309   CO2 24 11/17/2015 0309   GLUCOSE 114 (H) 11/17/2015 0309   BUN <5 (L) 11/17/2015 0309   CREATININE 0.43 (L) 11/17/2015 0309   CALCIUM 8.0 (L) 11/17/2015 0309   GFRNONAA >60 11/17/2015 0309   GFRAA >60 11/17/2015 0309     Assessment/Plan: 3 Days Post-Op   Active Problems:   Closed right hip fracture (HCC)   Seizures (H[facility])   PTSD (post-traumatic stress disorder)   Anxiety   Tobacco use disorder   Closed fracture of right hip ([place])   Fall   Preop examination   Closed displaced fracture of right femoral neck ([place])   WBAT with walker DVT ppx: apixaban, SCDs, TEDs PO pain control PT/OT Dispo: D/C home today with HHPT   Jennifer Stevens, Jennifer Stevens 11/18/2015, 7:38 AM   Rod Can, MD Cell [phone]

## 2015-11-18 NOTE — Progress Notes (Signed)
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 She was reluctant at first to work with therapy 2nd to pain but she decided to try. She fell going down the astairs at her house. She reports her left knee frequently buckles on her. She reports frequnet back, neck knee, and hip pain.  Pertinent PMH. PTSD, anxiety    PT Comments    Current plan remains appropriate.   Follow Up Recommendations  Home health PT;Supervision/Assistance - 24 hour (pt tells me she has 24 hour assist; would like this verified)     Equipment Recommendations  Rolling walker with 5" wheels;3in1 (PT)    Recommendations for Other Services       Precautions / Restrictions Precautions Precautions: Fall Precaution Comments: frequent falls which she attributes to an unstable L knee Restrictions Weight Bearing Restrictions: Yes RLE Weight Bearing: Weight bearing as tolerated    Mobility  Bed Mobility Overal bed mobility: Modified Independent Bed Mobility: Supine to Sit;Sit to Supine     Supine to sit: Supervision;HOB elevated (Use of trapeze bar) Sit to supine: Supervision;HOB elevated   General bed mobility comments: increased time/effort; HOB flat no use of rails or trapeze  Transfers Overall transfer level: Needs assistance Equipment used: Rolling walker (2 wheeled) Transfers: Sit to/from Stand Sit to Stand: Supervision         General transfer comment: cues for safe hand placement when sitting  Ambulation/Gait Ambulation/Gait assistance: Supervision Ambulation Distance (Feet): 100 Feet Assistive device: Rolling walker (2 wheeled) Gait Pattern/deviations: Step-through pattern;Decreased stance time - right;Decreased step length - left;Decreased stride length;Decreased weight shift to right;Antalgic      General Gait Details: cues for sequencing and posture; slow, steady gait; mildly antalgic   Stairs Stairs: Yes Stairs assistance: Min guard Stair Management: Two rails;Forwards;Step to pattern Number of Stairs: 2 General stair comments: cues for sequencing and technique  Wheelchair Mobility    Modified Rankin (Stroke Patients Only)       Balance Overall balance assessment: Needs assistance Sitting-balance support: No upper extremity supported;Feet supported Sitting balance-Leahy Scale: Good     Standing balance support: Single extremity supported;During functional activity Standing balance-Leahy Scale: Poor                      Cognition Arousal/Alertness: Awake/alert Behavior During Therapy: WFL for tasks assessed/performed Overall Cognitive Status: Within Functional Limits for tasks assessed       Memory: Decreased short-term memory              Exercises      General Comments General comments (skin integrity, edema, etc.): pt given HEP handout      Pertinent Vitals/Pain Pain Assessment: Faces Pain Score: 10-Worst pain ever Faces Pain Scale: Hurts little more Pain Location: R hip Pain Descriptors / Indicators: Sore Pain Intervention(s): Limited activity within patient's tolerance;Monitored during session;Premedicated before session;Repositioned;Ice applied    Home Living                      Prior Function            PT Goals (current goals can now be found in the care plan section) Acute Rehab PT Goals Patient Stated Goal: to go home  PT Goal Formulation: With patient/family Time For Goal Achievement: 11/30/15 Potential to Achieve Goals:  Good Progress towards PT goals: Progressing toward goals    Frequency    7X/week      PT Plan Current plan remains appropriate    Co-evaluation             End of Session Equipment Utilized During Treatment: Gait belt Activity Tolerance: Patient tolerated treatment  well Patient left: with call bell/phone within reach;in bed     Time: 7253-6644 PT Time Calculation (min) (ACUTE ONLY): 21 min  Charges:  $Gait Training: 8-22 mins                    G Codes:      Salina April, PTA Pager: [phone]   11/18/2015, 10:24 AM

## 2015-11-18 NOTE — Progress Notes (Signed)
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 She was reluctant at first to work with therapy 2nd to pain but she decided to try. She fell going down the astairs at her house. She reports her left knee frequently buckles on her. She reports frequnet back, neck knee, and hip pain.  Pertinent PMH. PTSD, anxiety   OT comments  Pt initially hesitant to participate with OT but willing after encouragement. Pt progressing well toward OT goals. Instructed pt concerning safe tub transfers and pt required min assist with this and cues for sequencing and safety. Also continued education concerning dressing techniques, toilet transfers, energy conservation, fall prevention, and safe use of DME. Pt plans to D/C home today with assistance from family and reports that someone will be with her 24 hours/day. Pt would benefit from continued OT services while admitted to improve independence with ADL and functional mobility. D/C plan remains appropriate. OT will continue to follow acutely.   Follow Up Recommendations  Home health OT;Supervision/Assistance - 24 hour    Equipment Recommendations  3 in 1 bedside comode;Other (comment) (Has been delivered to room)       Precautions / Restrictions Precautions Precautions: Fall Precaution Comments: frequent falls which she attributes to an unstable L knee Restrictions Weight Bearing Restrictions: Yes RLE Weight Bearing: Weight bearing as tolerated       Mobility Bed Mobility Overal bed mobility: Needs Assistance Bed Mobility: Supine to Sit;Sit to Supine     Supine to sit: Supervision;HOB elevated (Use of trapeze bar) Sit to supine: Supervision;HOB elevated      Transfers Overall transfer level: Needs assistance Equipment used: Rolling walker (2  wheeled) Transfers: Sit to/from Stand Sit to Stand: Min guard         General transfer comment: Cues for hand placement and safety    Balance Overall balance assessment: Needs assistance Sitting-balance support: No upper extremity supported;Feet supported Sitting balance-Leahy Scale: Good     Standing balance support: Single extremity supported;During functional activity Standing balance-Leahy Scale: Poor                     ADL Overall ADL's : Needs assistance/impaired                 Upper Body Dressing : Set up;Sitting   Lower Body Dressing: Min guard;Sit to/from stand   Toilet Transfer: Min guard;Ambulation;RW   Toileting- Water quality scientist and Hygiene: Min guard;Sit to/from stand   Tub/ Shower Transfer: Minimal assistance;Tub transfer;Min guard;Ambulation;3 in 1;Rolling walker Tub/Shower Transfer Details (indicate cue type and reason): Increased cues for sequencing and safety. Min assist to maintain balance during transfer. Functional mobility during ADLs: Min guard;Rolling walker General ADL Comments: Educated pt on safe tub transfer, safe use of DME, fall prevention, and energy conservation psot-acute D/C.                Cognition   Behavior During Therapy: WFL for tasks assessed/performed Overall Cognitive Status: Within Functional Limits for tasks assessed       Memory: Decreased short-term memory                            Pertinent Vitals/ Pain       Pain Assessment: 0-10 Pain Score: 10-Worst pain ever Pain Location:  R hip Pain Descriptors / Indicators: Aching Pain Intervention(s): Monitored during session;Ice applied         Frequency  Min 2X/week        Progress Toward Goals  OT Goals(current goals can now be found in the care plan section)  Progress towards OT goals: Progressing toward goals  Acute Rehab OT Goals Patient Stated Goal: to go home  OT Goal Formulation: With patient Time For Goal  Achievement: 11/24/15 Potential to Achieve Goals: Good ADL Goals Pt Will Perform Lower Body Bathing: with modified independence;sit to/from stand Pt Will Perform Lower Body Dressing: with modified independence;sit to/from stand Pt Will Transfer to Toilet: with modified independence;ambulating Pt Will Perform Toileting - Clothing Manipulation and hygiene: with modified independence;sit to/from stand Pt Will Perform Tub/Shower Transfer: with modified independence;ambulating;rolling walker;3 in 1  Plan Discharge plan remains appropriate       End of Session Equipment Utilized During Treatment: Gait belt;Rolling walker   Activity Tolerance Patient tolerated treatment well   Patient Left in bed;with call bell/phone within reach;with bed alarm set           Time: 9675-9163 OT Time Calculation (min): 55 min  Charges: OT General Charges $OT Visit: 1 Procedure OT Treatments $Self Care/Home Management : 53-67 mins  Norman Herrlich, OTR/L [phone] 11/18/2015, 9:27 AM

## 2015-11-18 NOTE — Progress Notes (Signed)
Discharge Note:  Patient alert and oriented X 4 and in no distress.  Patient given discharge instructions regarding signs and symptoms to report, wound care, constipation prevention, medications, diet, activity, and upcoming appointments.  She verbalized understanding of all instructions.  Peripheral IVs discontinued. Patient confirmed that she had all of her personal belongings and equipment. She will be transported out by hospital staff once her ride arrives.

## 2016-12-27 DIAGNOSIS — J449 Chronic obstructive pulmonary disease, unspecified: Secondary | ICD-10-CM

## 2016-12-27 HISTORY — DX: Chronic obstructive pulmonary disease, unspecified: J44.9

## 2018-04-25 ENCOUNTER — Encounter (HOSPITAL_COMMUNITY): Payer: Self-pay | Admitting: Emergency Medicine

## 2018-04-25 ENCOUNTER — Inpatient Hospital Stay (HOSPITAL_COMMUNITY)
Admission: EM | Admit: 2018-04-25 | Discharge: 2018-04-30 | DRG: 180 | Disposition: A | Discharge status: Home / Self Care | Payer: Medicare Other | Attending: Internal Medicine | Admitting: Internal Medicine

## 2018-04-25 ENCOUNTER — Inpatient Hospital Stay (HOSPITAL_COMMUNITY): Payer: Medicare Other

## 2018-04-25 ENCOUNTER — Other Ambulatory Visit: Payer: Self-pay

## 2018-04-25 ENCOUNTER — Emergency Department (HOSPITAL_COMMUNITY): Payer: Medicare Other

## 2018-04-25 DIAGNOSIS — Z681 Body mass index (BMI) 19 or less, adult: Secondary | ICD-10-CM

## 2018-04-25 DIAGNOSIS — G8929 Other chronic pain: Secondary | ICD-10-CM | POA: Diagnosis present

## 2018-04-25 DIAGNOSIS — F419 Anxiety disorder, unspecified: Secondary | ICD-10-CM | POA: Diagnosis not present

## 2018-04-25 DIAGNOSIS — E876 Hypokalemia: Secondary | ICD-10-CM | POA: Diagnosis present

## 2018-04-25 DIAGNOSIS — L8991 Pressure ulcer of unspecified site, stage 1: Secondary | ICD-10-CM | POA: Diagnosis not present

## 2018-04-25 DIAGNOSIS — Z20828 Contact with and (suspected) exposure to other viral communicable diseases: Secondary | ICD-10-CM | POA: Diagnosis present

## 2018-04-25 DIAGNOSIS — Z886 Allergy status to analgesic agent status: Secondary | ICD-10-CM | POA: Diagnosis not present

## 2018-04-25 DIAGNOSIS — M549 Dorsalgia, unspecified: Secondary | ICD-10-CM | POA: Diagnosis present

## 2018-04-25 DIAGNOSIS — C3432 Malignant neoplasm of lower lobe, left bronchus or lung: Principal | ICD-10-CM | POA: Diagnosis present

## 2018-04-25 DIAGNOSIS — Z888 Allergy status to other drugs, medicaments and biological substances status: Secondary | ICD-10-CM | POA: Diagnosis not present

## 2018-04-25 DIAGNOSIS — Z7901 Long term (current) use of anticoagulants: Secondary | ICD-10-CM | POA: Diagnosis not present

## 2018-04-25 DIAGNOSIS — C3490 Malignant neoplasm of unspecified part of unspecified bronchus or lung: Secondary | ICD-10-CM

## 2018-04-25 DIAGNOSIS — F4310 Post-traumatic stress disorder, unspecified: Secondary | ICD-10-CM | POA: Diagnosis present

## 2018-04-25 DIAGNOSIS — L89151 Pressure ulcer of sacral region, stage 1: Secondary | ICD-10-CM | POA: Diagnosis present

## 2018-04-25 DIAGNOSIS — R197 Diarrhea, unspecified: Secondary | ICD-10-CM | POA: Diagnosis present

## 2018-04-25 DIAGNOSIS — Z79891 Long term (current) use of opiate analgesic: Secondary | ICD-10-CM | POA: Diagnosis not present

## 2018-04-25 DIAGNOSIS — J181 Lobar pneumonia, unspecified organism: Secondary | ICD-10-CM | POA: Diagnosis not present

## 2018-04-25 DIAGNOSIS — E43 Unspecified severe protein-calorie malnutrition: Secondary | ICD-10-CM | POA: Diagnosis present

## 2018-04-25 DIAGNOSIS — R7989 Other specified abnormal findings of blood chemistry: Secondary | ICD-10-CM | POA: Diagnosis not present

## 2018-04-25 DIAGNOSIS — R0602 Shortness of breath: Secondary | ICD-10-CM | POA: Diagnosis not present

## 2018-04-25 DIAGNOSIS — J189 Pneumonia, unspecified organism: Secondary | ICD-10-CM | POA: Diagnosis present

## 2018-04-25 DIAGNOSIS — Z801 Family history of malignant neoplasm of trachea, bronchus and lung: Secondary | ICD-10-CM | POA: Diagnosis not present

## 2018-04-25 DIAGNOSIS — D630 Anemia in neoplastic disease: Secondary | ICD-10-CM | POA: Diagnosis present

## 2018-04-25 DIAGNOSIS — R778 Other specified abnormalities of plasma proteins: Secondary | ICD-10-CM | POA: Diagnosis present

## 2018-04-25 DIAGNOSIS — Z96641 Presence of right artificial hip joint: Secondary | ICD-10-CM | POA: Diagnosis present

## 2018-04-25 DIAGNOSIS — J9601 Acute respiratory failure with hypoxia: Secondary | ICD-10-CM | POA: Diagnosis present

## 2018-04-25 DIAGNOSIS — Z79899 Other long term (current) drug therapy: Secondary | ICD-10-CM

## 2018-04-25 DIAGNOSIS — E871 Hypo-osmolality and hyponatremia: Secondary | ICD-10-CM | POA: Diagnosis present

## 2018-04-25 DIAGNOSIS — F17200 Nicotine dependence, unspecified, uncomplicated: Secondary | ICD-10-CM | POA: Diagnosis not present

## 2018-04-25 DIAGNOSIS — F17210 Nicotine dependence, cigarettes, uncomplicated: Secondary | ICD-10-CM | POA: Diagnosis present

## 2018-04-25 DIAGNOSIS — J440 Chronic obstructive pulmonary disease with acute lower respiratory infection: Secondary | ICD-10-CM | POA: Diagnosis present

## 2018-04-25 DIAGNOSIS — L8990 Pressure ulcer of unspecified site, unspecified stage: Secondary | ICD-10-CM

## 2018-04-25 HISTORY — DX: Hypokalemia: E87.6

## 2018-04-25 HISTORY — DX: Chronic obstructive pulmonary disease, unspecified: J44.9

## 2018-04-25 LAB — CBC WITH DIFFERENTIAL/PLATELET
Abs Immature Granulocytes: 0.1 10*3/uL — ABNORMAL HIGH (ref 0.00–0.07)
Basophils Absolute: 0 10*3/uL (ref 0.0–0.1)
Basophils Relative: 0 %
Eosinophils Absolute: 0 10*3/uL (ref 0.0–0.5)
Eosinophils Relative: 0 %
HCT: 31.1 % — ABNORMAL LOW (ref 36.0–46.0)
Hemoglobin: 9.9 g/dL — ABNORMAL LOW (ref 12.0–15.0)
Immature Granulocytes: 1 %
Lymphocytes Relative: 11 %
Lymphs Abs: 1.3 10*3/uL (ref 0.7–4.0)
MCH: 26.3 pg (ref 26.0–34.0)
MCHC: 31.8 g/dL (ref 30.0–36.0)
MCV: 82.5 fL (ref 80.0–100.0)
Monocytes Absolute: 0.9 10*3/uL (ref 0.1–1.0)
Monocytes Relative: 7 %
Neutro Abs: 9.7 10*3/uL — ABNORMAL HIGH (ref 1.7–7.7)
Neutrophils Relative %: 81 %
Platelets: 453 10*3/uL — ABNORMAL HIGH (ref 150–400)
RBC: 3.77 MIL/uL — ABNORMAL LOW (ref 3.87–5.11)
RDW: 16.8 % — ABNORMAL HIGH (ref 11.5–15.5)
WBC: 12 10*3/uL — ABNORMAL HIGH (ref 4.0–10.5)
nRBC: 0 % (ref 0.0–0.2)

## 2018-04-25 LAB — MAGNESIUM: Magnesium: 1.9 mg/dL (ref 1.7–2.4)

## 2018-04-25 LAB — COMPREHENSIVE METABOLIC PANEL
ALT: 11 U/L (ref 0–44)
AST: 15 U/L (ref 15–41)
Albumin: 2.4 g/dL — ABNORMAL LOW (ref 3.5–5.0)
Alkaline Phosphatase: 99 U/L (ref 38–126)
Anion gap: 12 (ref 5–15)
BUN: 7 mg/dL — ABNORMAL LOW (ref 8–23)
CO2: 26 mmol/L (ref 22–32)
Calcium: 7.8 mg/dL — ABNORMAL LOW (ref 8.9–10.3)
Chloride: 93 mmol/L — ABNORMAL LOW (ref 98–111)
Creatinine, Ser: 0.47 mg/dL (ref 0.44–1.00)
GFR calc Af Amer: >60 mL/min (ref >60)
GFR calc non Af Amer: >60 mL/min (ref >60)
Glucose, Bld: 105 mg/dL — ABNORMAL HIGH (ref 70–99)
Potassium: 2.4 mmol/L — CL (ref 3.5–5.1)
Sodium: 131 mmol/L — ABNORMAL LOW (ref 135–145)
Total Bilirubin: 0.3 mg/dL (ref 0.3–1.2)
Total Protein: 6.4 g/dL — ABNORMAL LOW (ref 6.5–8.1)

## 2018-04-25 LAB — URINALYSIS, ROUTINE W REFLEX MICROSCOPIC
Bilirubin Urine: NEGATIVE
Glucose, UA: NEGATIVE mg/dL
Hgb urine dipstick: NEGATIVE
Ketones, ur: NEGATIVE mg/dL
Leukocytes,Ua: NEGATIVE
Nitrite: NEGATIVE
Protein, ur: NEGATIVE mg/dL
Specific Gravity, Urine: 1.009 (ref 1.005–1.030)
pH: 8 (ref 5.0–8.0)

## 2018-04-25 LAB — TROPONIN I
Troponin I: 0.06 ng/mL (ref <0.03)
Troponin I: 0.04 ng/mL (ref <0.03)

## 2018-04-25 LAB — SARS CORONAVIRUS 2 BY RT PCR (HOSPITAL ORDER, PERFORMED IN ~~LOC~~ HOSPITAL LAB): SARS Coronavirus 2: NEGATIVE

## 2018-04-25 MED ORDER — GABAPENTIN 300 MG PO CAPS
300.0000 mg | ORAL_CAPSULE | Freq: Three times a day (TID) | ORAL | Status: DC
  Administered 2018-04-25 – 2018-04-30 (×14): 300 mg via ORAL
  Filled 2018-04-25 (×14): qty 1

## 2018-04-25 MED ORDER — SODIUM CHLORIDE 0.9 % IV SOLN
1.0000 g | INTRAVENOUS | Status: DC
  Administered 2018-04-26 – 2018-04-29 (×4): 1 g via INTRAVENOUS
  Filled 2018-04-25 (×4): qty 10

## 2018-04-25 MED ORDER — GUAIFENESIN-DM 100-10 MG/5ML PO SYRP: 10.0000 mL | ORAL_SOLUTION | ORAL | Status: DC | PRN

## 2018-04-25 MED ORDER — FLUOXETINE HCL 20 MG PO CAPS
20.0000 mg | ORAL_CAPSULE | Freq: Every morning | ORAL | Status: DC
  Administered 2018-04-26 – 2018-04-30 (×5): 20 mg via ORAL
  Filled 2018-04-25 (×5): qty 1

## 2018-04-25 MED ORDER — HYDROCODONE-ACETAMINOPHEN 10-325 MG PO TABS
1.0000 | ORAL_TABLET | Freq: Four times a day (QID) | ORAL | Status: DC | PRN
  Administered 2018-04-26 – 2018-04-30 (×12): 1 via ORAL
  Filled 2018-04-25 (×12): qty 1

## 2018-04-25 MED ORDER — IOHEXOL 300 MG/ML  SOLN
75.0000 mL | Freq: Once | INTRAMUSCULAR | Status: AC | PRN
  Administered 2018-04-25: 21:00:00 75 mL via INTRAVENOUS

## 2018-04-25 MED ORDER — ONDANSETRON 4 MG PO TBDP: 4.0000 mg | ORAL_TABLET | Freq: Three times a day (TID) | ORAL | Status: DC | PRN

## 2018-04-25 MED ORDER — PRO-STAT SUGAR FREE PO LIQD
30.0000 mL | Freq: Two times a day (BID) | ORAL | Status: DC
  Administered 2018-04-25 – 2018-04-29 (×7): 30 mL via ORAL
  Filled 2018-04-25 (×12): qty 30

## 2018-04-25 MED ORDER — METHYLPREDNISOLONE SODIUM SUCC 40 MG IJ SOLR
80.0000 mg | Freq: Once | INTRAMUSCULAR | Status: AC
  Administered 2018-04-25: 18:00:00 80 mg via INTRAVENOUS
  Filled 2018-04-25: qty 2

## 2018-04-25 MED ORDER — TRAZODONE HCL 50 MG PO TABS
300.0000 mg | ORAL_TABLET | Freq: Every day | ORAL | Status: DC
  Administered 2018-04-25 – 2018-04-29 (×5): 300 mg via ORAL
  Filled 2018-04-25 (×5): qty 6

## 2018-04-25 MED ORDER — SODIUM CHLORIDE 0.9 % IV SOLN
500.0000 mg | INTRAVENOUS | Status: DC
  Administered 2018-04-25 – 2018-04-27 (×3): 500 mg via INTRAVENOUS
  Filled 2018-04-25 (×3): qty 500

## 2018-04-25 MED ORDER — QUETIAPINE FUMARATE 100 MG PO TABS
300.0000 mg | ORAL_TABLET | Freq: Two times a day (BID) | ORAL | Status: DC
  Administered 2018-04-25 – 2018-04-30 (×10): 300 mg via ORAL
  Filled 2018-04-25 (×10): qty 3

## 2018-04-25 MED ORDER — POTASSIUM CHLORIDE 10 MEQ/100ML IV SOLN
10.0000 meq | Freq: Once | INTRAVENOUS | Status: AC
  Administered 2018-04-25: 10 meq via INTRAVENOUS
  Filled 2018-04-25: qty 100

## 2018-04-25 MED ORDER — BENZONATATE 100 MG PO CAPS
100.0000 mg | ORAL_CAPSULE | Freq: Three times a day (TID) | ORAL | Status: DC | PRN
  Administered 2018-04-25 – 2018-04-27 (×2): 100 mg via ORAL
  Filled 2018-04-25 (×2): qty 1

## 2018-04-25 MED ORDER — ENOXAPARIN SODIUM 40 MG/0.4ML ~~LOC~~ SOLN
40.0000 mg | SUBCUTANEOUS | Status: DC
  Administered 2018-04-25 – 2018-04-29 (×5): 40 mg via SUBCUTANEOUS
  Filled 2018-04-25 (×5): qty 0.4

## 2018-04-25 MED ORDER — ALPRAZOLAM 0.5 MG PO TABS
0.5000 mg | ORAL_TABLET | Freq: Four times a day (QID) | ORAL | Status: DC | PRN
  Administered 2018-04-25 – 2018-04-30 (×14): 0.5 mg via ORAL
  Filled 2018-04-25 (×14): qty 1

## 2018-04-25 MED ORDER — TIZANIDINE HCL 4 MG PO TABS
4.0000 mg | ORAL_TABLET | Freq: Four times a day (QID) | ORAL | Status: DC | PRN
  Administered 2018-04-26 (×2): 4 mg via ORAL
  Filled 2018-04-25 (×2): qty 1

## 2018-04-25 MED ORDER — ALBUTEROL SULFATE HFA 108 (90 BASE) MCG/ACT IN AERS
1.0000 | INHALATION_SPRAY | Freq: Four times a day (QID) | RESPIRATORY_TRACT | Status: DC | PRN
  Administered 2018-04-30: 2 via RESPIRATORY_TRACT
  Filled 2018-04-25: qty 6.7

## 2018-04-25 MED ORDER — ALBUTEROL SULFATE HFA 108 (90 BASE) MCG/ACT IN AERS
4.0000 | INHALATION_SPRAY | Freq: Once | RESPIRATORY_TRACT | Status: AC
  Administered 2018-04-25: 18:00:00 4 via RESPIRATORY_TRACT
  Filled 2018-04-25: qty 6.7

## 2018-04-25 MED ORDER — POTASSIUM CHLORIDE CRYS ER 20 MEQ PO TBCR
60.0000 meq | EXTENDED_RELEASE_TABLET | Freq: Once | ORAL | Status: AC
  Administered 2018-04-25: 60 meq via ORAL
  Filled 2018-04-25: qty 3

## 2018-04-25 MED ORDER — FLUTICASONE FUROATE-VILANTEROL 200-25 MCG/INH IN AEPB
1.0000 | INHALATION_SPRAY | Freq: Every day | RESPIRATORY_TRACT | Status: DC
  Administered 2018-04-26 – 2018-04-30 (×4): 1 via RESPIRATORY_TRACT
  Filled 2018-04-25: qty 28

## 2018-04-25 MED ORDER — POTASSIUM CHLORIDE IN NACL 20-0.9 MEQ/L-% IV SOLN
INTRAVENOUS | Status: AC
  Administered 2018-04-25: 23:00:00 via INTRAVENOUS

## 2018-04-25 MED ORDER — NICOTINE 21 MG/24HR TD PT24
21.0000 mg | MEDICATED_PATCH | Freq: Every day | TRANSDERMAL | Status: DC
  Administered 2018-04-26 – 2018-04-30 (×5): 21 mg via TRANSDERMAL
  Filled 2018-04-25 (×6): qty 1

## 2018-04-25 MED ORDER — SODIUM CHLORIDE 0.9 % IV SOLN
1.0000 g | Freq: Once | INTRAVENOUS | Status: AC
  Administered 2018-04-25: 1 g via INTRAVENOUS
  Filled 2018-04-25: qty 10

## 2018-04-25 NOTE — H&P (Addendum)
TRH H&P    Patient Demographics:    Jennifer Stevens, is a 63 y.o. female  MRN: [id number]  DOB - 1955/01/29  Admit Date - 04/25/2018  Referring MD/NP/PA: Virgel Manifold  Outpatient Primary MD for the patient is Patient, No Pcp Per [name] (Family [facility])  Patient coming from:   home  Chief complaint-  SOB   HPI:    Jennifer Stevens  is a 63 y.o. female,w Copd , Chronic Nicotine Dependence, PTSD/ Anxiety, Chronic back pain, who apparently presents with c/o dyspnea and productive cough (yellow) for the past 4 weeks.  Pt was apparently seen by her PCP and treated w levaquin about 1 week ago without relief.  Pt states her breathing was worse and therefore presented to ED.  Pt fell on side about 4 days ago and has left lower chest discomfort "points to left upper abdomen" since then. No radiation.  Nothing appears to make better or worse in particular.  Pt has tried albuterol and advair at home without relief.   Pt denies fever, chills, palp, n/v, diarrhea, brbpr, black stool.  Pt denies any recent travel, any covid exposure, or any sick contact.     In ED,  T 98.4,  P 78  R 24, Bp 123/82  Pox 86% on RA  Wbc 12.0, hgb 9.9, Plt 453,   Bun 7, Creatinine 0.47   Na 131, K 2.4 Alb 2.4 Trop 0.06  EKG:  nsr at 90, nl axis, nl int,  St depression in v4-6 (old)   CXR IMPRESSION: Extensive consolidation and volume loss left lower lobe. Suspect Pneumonia  Pt will be admitted for CAP, Hypokalemia , Hyponatremia, Anemia, and Troponin elevation.      Review of systems:    In addition to the HPI above,  No Fever-chills, No Headache, No changes with Vision or hearing, No problems swallowing food or Liquids,  No Abdominal pain, No Nausea or Vomiting, bowel movements are regular, No Blood in stool or Urine, No dysuria, No new skin rashes or bruises, No new joints pains-aches,  No new weakness,  tingling, numbness in any extremity, + weight loss of 5 kg (11lbs) No polyuria, polydypsia or polyphagia, No significant Mental Stressors.  All other systems reviewed and are negative.    Past History of the following :    Past Medical History:  Diagnosis Date  . Anxiety   . COPD (chronic obstructive pulmonary disease) ([place]) 12/27/2016  . Hypokalemia 04/25/2018  . PTSD (post-traumatic stress disorder)   . Seizure in infant [facility])    suspect febrile seizure. Pt unsure of history.       Past Surgical History:  Procedure Laterality Date  . BACK SURGERY    . CESAREAN SECTION    . NECK SURGERY    . TOTAL HIP ARTHROPLASTY Right 11/15/2015   Procedure: TOTAL HIP ARTHROPLASTY ANTERIOR APPROACH;  Surgeon: Rod Can, MD;  Location: [facility];  Service: Orthopedics;  Laterality: Right;      Social History:      Social History  Tobacco Use  . Smoking status: Current Every Day Smoker    Packs/day: 1.00    Types: Cigarettes  . Smokeless tobacco: Never Used  Substance Use Topics  . Alcohol use: No       Family History :     Family History  Problem Relation Age of Onset  . Lung cancer Sister        Home Medications:   Prior to Admission medications   Medication Sig Start Date End Date Taking? Authorizing Provider  albuterol (PROVENTIL HFA;VENTOLIN HFA) 108 (90 Base) MCG/ACT inhaler Inhale 1-2 puffs into the lungs every 6 (six) hours as needed for wheezing or shortness of breath.   Yes [provider]  ALPRAZolam Duanne Moron) 1 MG tablet Take 0.5-1 mg by mouth 4 (four) times daily.    Yes [provider]  amphetamine-dextroamphetamine (ADDERALL) 20 MG tablet Take 10-20 mg by mouth See admin instructions. 97m in the morning and 133min the afternoon   Yes [provider]  FLUoxetine (PROZAC) 20 MG capsule Take 20 mg by mouth every morning. 02/27/18  Yes [provider]  fluticasone-salmeterol (ADVAIR HFA) 115-21 MCG/ACT inhaler Inhale 2  puffs into the lungs 2 (two) times a day. 04/15/18  Yes [provider]  gabapentin (NEURONTIN) 300 MG capsule Take 300 mg by mouth 3 (three) times daily.   Yes [provider]  HYDROcodone-acetaminophen (NORCO) 10-325 MG tablet Take 0.5-1 tablets by mouth 5 (five) times daily.    Yes [provider]  ondansetron (ZOFRAN-ODT) 4 MG disintegrating tablet Take 4 mg by mouth every 8 (eight) hours as needed for nausea or vomiting.  04/15/18  Yes [provider]  QUEtiapine (SEROQUEL) 300 MG tablet Take 300 mg by mouth 2 (two) times daily.   Yes [provider]  tiZANidine (ZANAFLEX) 4 MG tablet Take 4 mg by mouth every 6 (six) hours as needed for muscle spasms.   Yes [provider]  traZODone (DESYREL) 150 MG tablet Take 300 mg by mouth at bedtime.   Yes [provider]  dexamethasone (DECADRON) 4 MG tablet Take 4 mg by mouth 2 (two) times a day. 10 day course starting on 04/15/2018 04/15/18 04/25/18  [provider]  HYDROcodone-homatropine (HYCODAN) 5-1.5 MG/5ML syrup Take 5 mLs by mouth every 6 (six) hours as needed for cough.  04/15/18 04/25/18  [provider]  levofloxacin (LEVAQUIN) 500 MG tablet Take 500 mg by mouth daily. 10 day course starting on 04/15/2018    [provider]     Allergies:     Allergies  Allergen Reactions  . Nsaids     "stomach problems"  . Statins     Stomach ache     Physical Exam:   Vitals  Blood pressure 123/82, pulse 78, temperature 98.4 F (36.9 C), temperature source Oral, resp. rate (!) 24, height _0  (1.727 m), weight 45.4 kg, SpO2 91 %.  1.  General: Axox3,   2. Psychiatric: Euthymic  3. Neurologic: cn2-12 intact, reflexes 2+ symmetric, diffuse with no clonus, motor 5/5 in all 4 ext  4. HEENMT:  Anicteric, pupils 1.85m21mymmetric, direct, consensual, near intact  5. Respiratory : Decrease in Bs at left lung base, slight e=> a, faint crackles, no wheezing   6. Cardiovascular : rrr s1, s2, no m/g/r  7. Gastrointestinal:  Abd: soft, nt, nd,+bs  8. Skin:  Ext no c/c/e,  No rash  9.Musculoskeletal:  Good ROM    Data Review:  CBC Recent Labs  Lab 04/25/18 1836  WBC 12.0*  HGB 9.9*  HCT 31.1*  PLT 453*  MCV 82.5  MCH 26.3  MCHC 31.8  RDW 16.8*  LYMPHSABS 1.3  MONOABS 0.9  EOSABS 0.0  BASOSABS 0.0   ------------------------------------------------------------------------------------------------------------------  Results for orders placed or performed during the hospital encounter of 04/25/18 (from the past 48 hour(s))  Urinalysis, Routine w reflex microscopic     Status: Abnormal   Collection Time: 04/25/18  5:48 PM  Result Value Ref Range   Color, Urine YELLOW YELLOW   APPearance HAZY (A) CLEAR   Specific Gravity, Urine 1.009 1.005 - 1.030   pH 8.0 5.0 - 8.0   Glucose, UA NEGATIVE NEGATIVE mg/dL   Hgb urine dipstick NEGATIVE NEGATIVE   Bilirubin Urine NEGATIVE NEGATIVE   Ketones, ur NEGATIVE NEGATIVE mg/dL   Protein, ur NEGATIVE NEGATIVE mg/dL   Nitrite NEGATIVE NEGATIVE   Leukocytes,Ua NEGATIVE NEGATIVE    Comment: Performed at [facility], [address], [place], [place] [address]  SARS Coronavirus 2 [facility] order, Performed in [place] hospital lab)     Status: None   Collection Time: 04/25/18  5:58 PM  Result Value Ref Range   SARS Coronavirus 2 NEGATIVE NEGATIVE    Comment: (NOTE) If result is NEGATIVE SARS-CoV-2 target nucleic acids are NOT DETECTED. The SARS-CoV-2 RNA is generally detectable in upper and lower  respiratory specimens during the acute phase of infection. The lowest  concentration of SARS-CoV-2 viral copies this assay can detect is 250  copies / mL. A negative result does not preclude SARS-CoV-2 infection  and should not be used as the sole basis for treatment or other  patient management decisions.  A negative result may occur with  improper specimen collection / handling,  submission of specimen other  than nasopharyngeal swab, presence of viral mutation(s) within the  areas targeted by this assay, and inadequate number of viral copies  (<250 copies / mL). A negative result must be combined with clinical  observations, patient history, and epidemiological information. If result is POSITIVE SARS-CoV-2 target nucleic acids are DETECTED. The SARS-CoV-2 RNA is generally detectable in upper and lower  respiratory specimens dur ing the acute phase of infection.  Positive  results are indicative of active infection with SARS-CoV-2.  Clinical  correlation with patient history and other diagnostic information is  necessary to determine patient infection status.  Positive results do  not rule out bacterial infection or co-infection with other viruses. If result is PRESUMPTIVE POSTIVE SARS-CoV-2 nucleic acids MAY BE PRESENT.   A presumptive positive result was obtained on the submitted specimen  and confirmed on repeat testing.  While 2019 novel coronavirus  (SARS-CoV-2) nucleic acids may be present in the submitted sample  additional confirmatory testing may be necessary for epidemiological  and / or clinical management purposes  to differentiate between  SARS-CoV-2 and other Sarbecovirus currently known to infect humans.  If clinically indicated additional testing with an alternate test  methodology [phone]) is advised. The SARS-CoV-2 RNA is generally  detectable in upper and lower respiratory sp ecimens during the acute  phase of infection. The expected result is Negative. Fact Sheet for Patients:  StrictlyIdeas.no Fact Sheet for Healthcare Providers: BankingDealers.co.za This test is not yet approved or cleared by the [place] FDA and has been authorized for detection and/or diagnosis of SARS-CoV-2 by FDA under an Emergency Use Authorization (EUA).  This EUA will remain in effect (meaning this test can be  used) for the duration of the COVID-19 declaration under Section 564(b)(1) of the Act, 21 U.S.C. section 360bbb-3(b)(1), unless the authorization is terminated or revoked sooner. Performed at [facility], [address], [place], [place] [address]   Comprehensive metabolic panel     Status: Abnormal   Collection Time: 04/25/18  6:36 PM  Result Value Ref Range   Sodium 131 (L) 135 - 145 mmol/L   Potassium 2.4 (LL) 3.5 - 5.1 mmol/L    Comment: CRITICAL RESULT CALLED TO, READ BACK BY AND VERIFIED WITH: MYRICK,B ON 04/25/18 AT 1935 BY LOY,C    Chloride 93 (L) 98 - 111 mmol/L   CO2 26 22 - 32 mmol/L   Glucose, Bld 105 (H) 70 - 99 mg/dL   BUN 7 (L) 8 - 23 mg/dL   Creatinine, Ser 0.47 0.44 - 1.00 mg/dL   Calcium 7.8 (L) 8.9 - 10.3 mg/dL   Total Protein 6.4 (L) 6.5 - 8.1 g/dL   Albumin 2.4 (L) 3.5 - 5.0 g/dL   AST 15 15 - 41 U/L   ALT 11 0 - 44 U/L   Alkaline Phosphatase 99 38 - 126 U/L   Total Bilirubin 0.3 0.3 - 1.2 mg/dL   GFR calc non Af Amer >60 >60 mL/min   GFR calc Af Amer >60 >60 mL/min   Anion gap 12 5 - 15    Comment: Performed at [facility], [address], [place], [place] [address]  CBC with Differential/Platelet     Status: Abnormal   Collection Time: 04/25/18  6:36 PM  Result Value Ref Range   WBC 12.0 (H) 4.0 - 10.5 K/uL   RBC 3.77 (L) 3.87 - 5.11 MIL/uL   Hemoglobin 9.9 (L) 12.0 - 15.0 g/dL   HCT 31.1 (L) 36.0 - 46.0 %   MCV 82.5 80.0 - 100.0 fL   MCH 26.3 26.0 - 34.0 pg   MCHC 31.8 30.0 - 36.0 g/dL   RDW 16.8 (H) 11.5 - 15.5 %   Platelets 453 (H) 150 - 400 K/uL   nRBC 0.0 0.0 - 0.2 %   Neutrophils Relative % 81 %   Neutro Abs 9.7 (H) 1.7 - 7.7 K/uL   Lymphocytes Relative 11 %   Lymphs Abs 1.3 0.7 - 4.0 K/uL   Monocytes Relative 7 %   Monocytes Absolute 0.9 0.1 - 1.0 K/uL   Eosinophils Relative 0 %   Eosinophils Absolute 0.0 0.0 - 0.5 K/uL   Basophils Relative 0 %   Basophils Absolute 0.0 0.0 - 0.1 K/uL   Immature Granulocytes 1 %   Abs Immature  Granulocytes 0.10 (H) 0.00 - 0.07 K/uL    Comment: Performed at [facility], [address] [place], [place] [address]  Troponin I - ONCE - STAT     Status: Abnormal   Collection Time: 04/25/18  6:36 PM  Result Value Ref Range   Troponin I 0.06 (HH) <0.03 ng/mL    Comment: CRITICAL RESULT CALLED TO, READ BACK BY AND VERIFIED WITH: MYRICK,B ON 04/25/18 AT 1935 BY LOY,C Performed at [facility], [address], [place], [place] [address]     Chemistries  Recent Labs  Lab 04/25/18 1836  NA 131*  K 2.4*  CL 93*  CO2 26  GLUCOSE 105*  BUN 7*  CREATININE 0.47  CALCIUM 7.8*  AST 15  ALT 11  ALKPHOS 99  BILITOT 0.3   ------------------------------------------------------------------------------------------------------------------  ------------------------------------------------------------------------------------------------------------------ GFR: Estimated Creatinine Clearance: 52.9 mL/min (by C-G formula based on SCr  of 0.47 mg/dL). Liver Function Tests: Recent Labs  Lab 04/25/18 1836  AST 15  ALT 11  ALKPHOS 99  BILITOT 0.3  PROT 6.4*  ALBUMIN 2.4*   No results for input(s): LIPASE, AMYLASE in the last 168 hours. No results for input(s): AMMONIA in the last 168 hours. Coagulation Profile: No results for input(s): INR, PROTIME in the last 168 hours. Cardiac Enzymes: Recent Labs  Lab 04/25/18 1836  TROPONINI 0.06*   BNP (last 3 results) No results for input(s): PROBNP in the last 8760 hours. HbA1C: No results for input(s): HGBA1C in the last 72 hours. CBG: No results for input(s): GLUCAP in the last 168 hours. Lipid Profile: No results for input(s): CHOL, HDL, LDLCALC, TRIG, CHOLHDL, LDLDIRECT in the last 72 hours. Thyroid Function Tests: No results for input(s): TSH, T4TOTAL, FREET4, T3FREE, THYROIDAB in the last 72 hours. Anemia Panel: No results for input(s): VITAMINB12, FOLATE, FERRITIN, TIBC, IRON, RETICCTPCT in the last 72 hours.   --------------------------------------------------------------------------------------------------------------- Urine analysis:    Component Value Date/Time   COLORURINE YELLOW 04/25/2018 1748   APPEARANCEUR HAZY (A) 04/25/2018 1748   LABSPEC 1.009 04/25/2018 1748   PHURINE 8.0 04/25/2018 [place] 04/25/2018 1748   HGBUR NEGATIVE 04/25/2018 1748   BILIRUBINUR NEGATIVE 04/25/2018 1748   KETONESUR NEGATIVE 04/25/2018 1748   PROTEINUR NEGATIVE 04/25/2018 1748   NITRITE NEGATIVE 04/25/2018 1748   LEUKOCYTESUR NEGATIVE 04/25/2018 1748      Imaging Results:    Dg Chest Portable 1 View  Result Date: 04/25/2018 CLINICAL DATA:  Dyspnea EXAM: PORTABLE CHEST 1 VIEW COMPARISON:  11/13/2015 FINDINGS: Extensive infiltrate in the left lower lobe has developed since the prior study. Volume loss with shift of the mediastinum to the left. Possible small left effusion Right lung is clear.  Negative for heart failure. IMPRESSION: Extensive consolidation and volume loss left lower lobe. Suspect pneumonia Electronically Signed   By: Franchot Gallo M.D.   On: 04/25/2018 18:45       Assessment & Plan:    Principal Problem:   Pneumonia Active Problems:   PTSD (post-traumatic stress disorder)   Anxiety   Tobacco use disorder   Elevated troponin   Hypokalemia   Severe protein-calorie malnutrition (HCC)  CAP Check CT scan chest r/o effusion due to decrease in bs at left base r/o malignancy in this heavy smoker with BMI 15.2 Check urine legionella antigen Check urine strep antigen Rocephin 1gm iv qday Zithromax 533m iv qday  Copd Cont Advair => Breo 1puff qday Cont Albuterol HFA 2puff qid and q6h prn   Hypokalemia Replete Check cmp in am Check magnesium in am  Hyponatremia ? Due to pneumonia Hydrate with ns iv Check cmp in am  Anemia (new, Hgb 12 09/02/2017) Check iron studies, b12, folate, ESR, consider SPEP/ UPEP Check cbc in am  Troponin elevation Check trop I  q6h x3 Check cardiac echo Cardiology consult ordered in computer  Severe protein calorie malnutrition prostat 30 mL po bid  Nicotine/ Tobacco dep Nicotine patch 21 mg topically qday Pt counselled on smoking cessation x 3 minutes  Chronic back pain Cont Gabapentin 3076mpo tid Cont Norco  Chronic anxiety/ ptsd Cont Seroquel 30040mo bid Cont Trazodone 300m35m qhs Cont Prozac 20mg65mqday Cont Xanax 0.5mg p7m6h prn   DVT Prophylaxis-   Lovenox - SCDs   AM Labs Ordered, also please review Full Orders  Family Communication: Admission, patients condition and plan of care including tests being ordered have  been discussed with the patient  who indicate understanding and agree with the plan and Code Status.  Code Status:   FULL CODE  Admission status: Observation/Inpatient: Based on patients clinical presentation and evaluation of above clinical data, I have made determination that patient meets Inpatient criteria at this time.  Pt was hypoxic and has pneumonia on CXR and failed oral abx,  will require iv abx, and inpatient admission due to this as well as severe hypokalemia and troponin elevation  Time spent in minutes :  70 minutes   Jani Gravel M.D on 04/25/2018 at 8:24 PM   Since she was admitted she did have the CT that is highly suggestive of lung cancer.  Discussed this at length with her yesterday and she agrees to proceed with fiberoptic bronchoscopy with probable biopsies.  We discussed alternate methods of diagnosis and she has agreed that bronchoscopy is the best way to go.  Extensive discussion of possible complications including bleeding pneumothorax and even death.

## 2018-04-25 NOTE — ED Provider Notes (Signed)
[facility] EMERGENCY DEPARTMENT Provider Note   CSN: [id number] Arrival date & time: 04/25/18  1710    History   Chief Complaint Chief Complaint  Patient presents with  . Shortness of Breath    HPI Jennifer Stevens is a 63 y.o. female.     HPI   62yF with cough, dyspnea and generalized weakness. Unsure of exact timeline but began over a week ago. She was evaluated through telemedicine on 4/10. Prescribed 10d of levaquin and decadron. She says she has been taking w/o improvement of symptoms. Coughing but cannot bring anything up. No fever. No unusual swelling. Poor appetite. Feels like she is losing weight. Soreness in chest and back when coughing. Hx of emphysema. She is not on supplemental oxygen at baseline.   Past Medical History:  Diagnosis Date  . Anxiety   . PTSD (post-traumatic stress disorder)   . Seizure in infant [facility])    suspect febrile seizure. Pt unsure of history.     Patient Active Problem List   Diagnosis Date Noted  . Closed displaced fracture of right femoral neck ([place]) 11/15/2015  . Closed right hip fracture ([place]) 11/13/2015  . Tobacco use disorder 11/13/2015  . Seizures ([place])   . PTSD (post-traumatic stress disorder)   . Anxiety   . Closed fracture of right hip ([place])   . Fall   . Preop examination     Past Surgical History:  Procedure Laterality Date  . BACK SURGERY    . CESAREAN SECTION    . NECK SURGERY    . TOTAL HIP ARTHROPLASTY Right 11/15/2015   Procedure: TOTAL HIP ARTHROPLASTY ANTERIOR APPROACH;  Surgeon: Rod Can, MD;  Location: [facility];  Service: Orthopedics;  Laterality: Right;     OB History    Gravida      Para      Term      Preterm      AB      Living  1     SAB      TAB      Ectopic      Multiple      Live Births               Home Medications    Prior to Admission medications   Medication Sig Start Date End Date Taking? Authorizing Provider  albuterol (PROVENTIL HFA;VENTOLIN HFA) 108 (90  Base) MCG/ACT inhaler Inhale 1-2 puffs into the lungs every 6 (six) hours as needed for wheezing or shortness of breath.    [provider]  ALPRAZolam Duanne Moron) 1 MG tablet Take 1 mg by mouth 3 (three) times daily.    [provider]  amphetamine-dextroamphetamine (ADDERALL) 20 MG tablet Take 20 mg by mouth daily.    [provider]  apixaban (ELIQUIS) 2.5 MG TABS tablet Take 1 tablet (2.5 mg total) by mouth 2 (two) times daily. 11/15/15   Swinteck, Aaron Edelman, MD  gabapentin (NEURONTIN) 300 MG capsule Take 300 mg by mouth 3 (three) times daily.    [provider]  oxyCODONE (OXY IR/ROXICODONE) 5 MG immediate release tablet Take 1-2 tablets (5-10 mg total) by mouth every 3 (three) hours as needed for moderate pain. 11/15/15   Swinteck, Aaron Edelman, MD  polyethylene glycol (MIRALAX / GLYCOLAX) packet Take 17 g by mouth daily as needed for mild constipation. 11/17/15   Paralee Cancel, MD  QUEtiapine (SEROQUEL) 100 MG tablet Take 100 mg by mouth 2 (two) times daily.    [provider]  QUEtiapine (SEROQUEL) 300 MG tablet Take 300 mg by mouth 2 (two) times daily.    [provider]  senna (SENOKOT) 8.6 MG TABS tablet Take 1 tablet (8.6 mg total) by mouth 2 (two) times daily. 11/17/15   Paralee Cancel, MD  tiZANidine (ZANAFLEX) 4 MG tablet Take 4 mg by mouth every 6 (six) hours as needed for muscle spasms.    [provider]  traZODone (DESYREL) 150 MG tablet Take 300 mg by mouth at bedtime.    [provider]    Family History History reviewed. No pertinent family history.  Social History Social History   Tobacco Use  . Smoking status: Current Every Day Smoker    Packs/day: 1.00    Types: Cigarettes  . Smokeless tobacco: Never Used  Substance Use Topics  . Alcohol use: No  . Drug use: No     Allergies   Nsaids   Review of Systems Review of Systems  All systems reviewed and negative, other than as noted in HPI.  Physical  Exam Updated Vital Signs BP 134/88   Pulse 83   Temp 98.4 F (36.9 C) (Oral)   Resp (!) 21   Ht 5\' 8"  (1.727 m)   Wt 45.4 kg   SpO2 91%   BMI 15.20 kg/m   Physical Exam Vitals signs and nursing note reviewed.  Constitutional:      Comments: Frail and chronically ill appearing but not acutely distressed.   HENT:     Head: Normocephalic and atraumatic.  Eyes:     General:        Right eye: No discharge.        Left eye: No discharge.     Conjunctiva/sclera: Conjunctivae normal.  Neck:     Musculoskeletal: Neck supple.  Cardiovascular:     Rate and Rhythm: Normal rate and regular rhythm.     Heart sounds: Normal heart sounds. No murmur. No friction rub. No gallop.   Pulmonary:     Effort: Pulmonary effort is normal. No respiratory distress.     Breath sounds: Wheezing present.     Comments: Decreased breath sounds b/l bases. Faint expiratory wheezing.  Abdominal:     General: There is no distension.     Palpations: Abdomen is soft.     Tenderness: There is no abdominal tenderness.  Musculoskeletal:        General: No tenderness.     Right lower leg: No edema.     Left lower leg: No edema.  Skin:    General: Skin is warm and dry.  Neurological:     Mental Status: She is alert.  Psychiatric:        Behavior: Behavior normal.        Thought Content: Thought content normal.      ED Treatments / Results  Labs (all labs ordered are listed, but only abnormal results are displayed) Labs Reviewed  COMPREHENSIVE METABOLIC PANEL - Abnormal; Notable for the following components:      Result Value   Sodium 131 (*)    Potassium 2.4 (*)    Chloride 93 (*)    Glucose, Bld 105 (*)    BUN 7 (*)    Calcium 7.8 (*)    Total Protein 6.4 (*)    Albumin 2.4 (*)    All other components within normal limits  CBC WITH DIFFERENTIAL/PLATELET - Abnormal; Notable for the following components:   WBC 12.0 (*)    RBC 3.77 (*)  Hemoglobin 9.9 (*)    HCT 31.1 (*)    RDW 16.8 (*)     Platelets 453 (*)    Neutro Abs 9.7 (*)    Abs Immature Granulocytes 0.10 (*)    All other components within normal limits  URINALYSIS, ROUTINE W REFLEX MICROSCOPIC - Abnormal; Notable for the following components:   APPearance HAZY (*)    All other components within normal limits  TROPONIN I - Abnormal; Notable for the following components:   Troponin I 0.06 (*)    All other components within normal limits  SARS CORONAVIRUS 2 (HOSPITAL ORDER, [facility])  EXPECTORATED SPUTUM ASSESSMENT W REFEX TO RESP CULTURE  GRAM STAIN  HIV ANTIBODY (ROUTINE TESTING W REFLEX)  MAGNESIUM  STREP PNEUMONIAE URINARY ANTIGEN  LEGIONELLA PNEUMOPHILA SEROGP 1 UR AG  CBC  COMPREHENSIVE METABOLIC PANEL    EKG EKG Interpretation  Date/Time:  Monday April 25 2018 17:20:04 EDT Ventricular Rate:  90 PR Interval:    QRS Duration: 99 QT Interval:  401 QTC Calculation: 439 R Axis:   71 Text Interpretation:  Sinus rhythm Borderline ST depression, lateral leads Confirmed by Virgel Manifold [id number]) on 04/25/2018 7:46:39 PM   Radiology Dg Chest Portable 1 View  Result Date: 04/25/2018 CLINICAL DATA:  Dyspnea EXAM: PORTABLE CHEST 1 VIEW COMPARISON:  11/13/2015 FINDINGS: Extensive infiltrate in the left lower lobe has developed since the prior study. Volume loss with shift of the mediastinum to the left. Possible small left effusion Right lung is clear.  Negative for heart failure. IMPRESSION: Extensive consolidation and volume loss left lower lobe. Suspect pneumonia Electronically Signed   By: Franchot Gallo M.D.   On: 04/25/2018 18:45    Procedures Procedures (including critical care time)  Medications Ordered in ED Medications  guaiFENesin-dextromethorphan (ROBITUSSIN DM) 100-10 MG/5ML syrup 10 mL (has no administration in time range)  albuterol (VENTOLIN HFA) 108 (90 Base) MCG/ACT inhaler 4 puff (has no administration in time range)  methylPREDNISolone sodium succinate  (SOLU-MEDROL) 40 mg/mL injection 80 mg (has no administration in time range)     Initial Impression / Assessment and Plan / ED Course  I have reviewed the triage vital signs and the nursing notes.  Pertinent labs & imaging results that were available during my care of the patient were reviewed by me and considered in my medical decision making (see chart for details).    62yF with cough and dyspnea. L sided infiltrate on CXR despite reporting compliance with extended course of levaquin. New oxygen requirement.  No known exposures to person with known SARS- CoV2. Rapid test was negative. Troponin minimally elevated. Some chest pain which is atypical and she describes as soreness. Her EKG today is not of great quality but looks fairly similar to previous. Probably demand ischemia. Atypical for ACS. Hypokalemia noted. Supplement. Check mag. Admit.    Jennifer Stevens was evaluated in Emergency Department on 04/25/2018 for the symptoms described in the history of present illness. She was evaluated in the context of the global COVID-19 pandemic, which necessitated consideration that the patient might be at risk for infection with the SARS-CoV-2 virus that causes COVID-19. Institutional protocols and algorithms that pertain to the evaluation of patients at risk for COVID-19 are in a state of rapid change based on information released by regulatory bodies including the CDC and federal and state organizations. These policies and algorithms were followed during the patient's care in the ED.  Final Clinical Impressions(s) / ED Diagnoses  Final diagnoses:  Community acquired pneumonia of left lung, unspecified part of lung  Hypokalemia    ED Discharge Orders    None       Virgel Manifold, MD 04/25/18 2002

## 2018-04-25 NOTE — ED Notes (Signed)
CRITICAL VALUE ALERT  Critical Value:  Potassium 2.4, troponin 0.06  Date & Time Notied:  04/25/18 19137  Provider Notified: [email or web address]  Orders Received/Actions taken: md notified

## 2018-04-25 NOTE — ED Triage Notes (Signed)
PT brought in by [facility] today for c/o SOB, and generalized weakness. PT states she had a virtual visit with her PCP last week and was told she possibly had pneumonia and started an antibiotic. PT states she finished the antibiotic a few days ago but SOB and generalized weakness has been worse. PT denies any fevers.

## 2018-04-26 ENCOUNTER — Inpatient Hospital Stay (HOSPITAL_COMMUNITY): Payer: Medicare Other

## 2018-04-26 DIAGNOSIS — E876 Hypokalemia: Secondary | ICD-10-CM

## 2018-04-26 DIAGNOSIS — J181 Lobar pneumonia, unspecified organism: Secondary | ICD-10-CM

## 2018-04-26 DIAGNOSIS — L8991 Pressure ulcer of unspecified site, stage 1: Secondary | ICD-10-CM

## 2018-04-26 DIAGNOSIS — L8990 Pressure ulcer of unspecified site, unspecified stage: Secondary | ICD-10-CM

## 2018-04-26 DIAGNOSIS — F419 Anxiety disorder, unspecified: Secondary | ICD-10-CM

## 2018-04-26 DIAGNOSIS — R0602 Shortness of breath: Secondary | ICD-10-CM

## 2018-04-26 LAB — ECHOCARDIOGRAM LIMITED
Height: 68 in
Weight: 1600 oz

## 2018-04-26 LAB — CBC
HCT: 29.4 % — ABNORMAL LOW (ref 36.0–46.0)
Hemoglobin: 9 g/dL — ABNORMAL LOW (ref 12.0–15.0)
MCH: 25.6 pg — ABNORMAL LOW (ref 26.0–34.0)
MCHC: 30.6 g/dL (ref 30.0–36.0)
MCV: 83.5 fL (ref 80.0–100.0)
Platelets: 474 10*3/uL — ABNORMAL HIGH (ref 150–400)
RBC: 3.52 MIL/uL — ABNORMAL LOW (ref 3.87–5.11)
RDW: 17 % — ABNORMAL HIGH (ref 11.5–15.5)
WBC: 6.8 10*3/uL (ref 4.0–10.5)
nRBC: 0 % (ref 0.0–0.2)

## 2018-04-26 LAB — IRON AND TIBC
Iron: 15 ug/dL — ABNORMAL LOW (ref 28–170)
Saturation Ratios: 7 % — ABNORMAL LOW (ref 10.4–31.8)
TIBC: 227 ug/dL — ABNORMAL LOW (ref 250–450)
UIBC: 212 ug/dL

## 2018-04-26 LAB — COMPREHENSIVE METABOLIC PANEL
ALT: 9 U/L (ref 0–44)
AST: 13 U/L — ABNORMAL LOW (ref 15–41)
Albumin: 2.3 g/dL — ABNORMAL LOW (ref 3.5–5.0)
Alkaline Phosphatase: 87 U/L (ref 38–126)
Anion gap: 9 (ref 5–15)
BUN: 8 mg/dL (ref 8–23)
CO2: 24 mmol/L (ref 22–32)
Calcium: 7.7 mg/dL — ABNORMAL LOW (ref 8.9–10.3)
Chloride: 99 mmol/L (ref 98–111)
Creatinine, Ser: 0.48 mg/dL (ref 0.44–1.00)
GFR calc Af Amer: >60 mL/min (ref >60)
GFR calc non Af Amer: >60 mL/min (ref >60)
Glucose, Bld: 122 mg/dL — ABNORMAL HIGH (ref 70–99)
Potassium: 3.8 mmol/L (ref 3.5–5.1)
Sodium: 132 mmol/L — ABNORMAL LOW (ref 135–145)
Total Bilirubin: 0.2 mg/dL — ABNORMAL LOW (ref 0.3–1.2)
Total Protein: 5.9 g/dL — ABNORMAL LOW (ref 6.5–8.1)

## 2018-04-26 LAB — SEDIMENTATION RATE: Sed Rate: 95 mm/hr — ABNORMAL HIGH (ref 0–22)

## 2018-04-26 LAB — STREP PNEUMONIAE URINARY ANTIGEN: Strep Pneumo Urinary Antigen: NEGATIVE

## 2018-04-26 LAB — VITAMIN B12: Vitamin B-12: 510 pg/mL (ref 180–914)

## 2018-04-26 LAB — FERRITIN: Ferritin: 258 ng/mL (ref 11–307)

## 2018-04-26 LAB — TROPONIN I
Troponin I: 0.03 ng/mL (ref <0.03)
Troponin I: 0.03 ng/mL (ref <0.03)

## 2018-04-26 LAB — HIV ANTIBODY (ROUTINE TESTING W REFLEX): HIV Screen 4th Generation wRfx: NONREACTIVE

## 2018-04-26 MED ORDER — ORAL CARE MOUTH RINSE
15.0000 mL | Freq: Two times a day (BID) | OROMUCOSAL | Status: DC
  Administered 2018-04-26 – 2018-04-29 (×6): 15 mL via OROMUCOSAL

## 2018-04-26 MED ORDER — ENSURE ENLIVE PO LIQD
237.0000 mL | Freq: Two times a day (BID) | ORAL | Status: DC
  Administered 2018-04-26 – 2018-04-30 (×7): 237 mL via ORAL

## 2018-04-26 MED ORDER — DEXTROSE-NACL 5-0.9 % IV SOLN
INTRAVENOUS | Status: DC
  Administered 2018-04-26 – 2018-04-29 (×2): via INTRAVENOUS

## 2018-04-26 MED ORDER — ADULT MULTIVITAMIN W/MINERALS CH
1.0000 | ORAL_TABLET | Freq: Every day | ORAL | Status: DC
  Administered 2018-04-26 – 2018-04-29 (×4): 1 via ORAL
  Filled 2018-04-26 (×4): qty 1

## 2018-04-26 NOTE — Progress Notes (Signed)
*  PRELIMINARY RESULTS* Echocardiogram 2D Echocardiogram LIMITED has been performed.  Jennifer Stevens 04/26/2018, 2:05 PM

## 2018-04-26 NOTE — Progress Notes (Signed)
Initial Nutrition Assessment  RD working remotely.    DOCUMENTATION CODES:   Underweight  INTERVENTION:  Ensure Enlive po BID, each supplement provides 350 kcal and 20 grams of protein   ProStat 30 ml TID (each 30 ml provides 100 kcal, 15 gr protein)   Multivitamin   NUTRITION DIAGNOSIS:  Increased nutrient needs related to chronic illness(acute on chronic respiratory disease) as evidenced by estimated needs. Complicated by poor appetite - energy intake < or equal to 75% for > or equal to 1 month and minimal reserves (BMI-15.2)  which places patient at high risk for severe malnutrition.  GOAL:   Patient will meet greater than or equal to 90% of their needs  MONITOR:   PO intake, Supplement acceptance, Labs, Weight trends, Skin  REASON FOR ASSESSMENT:   Malnutrition Screening Tool    ASSESSMENT: Patient is an underweight 63 yo female with history of COPD, PTSD, Anxiety and hypokalemia (2.4-critical low on admission). Patient has been coughing, poor appetite and experiencing generalized weakness for at least a week. Tobacco use daily-smokes 1 pack per day.   Talked with patient via telephone. She had her teeth pulled about 1 year ago to have dentures made. Poorly fitting dentures has impacted her intake overall. Complains that she has not felt well for the past month- due to flares with chronic emphysema and acute pneumonia.  At home she prefers cereal and grits and other soft foods due to chewing difficulty and says recently her primary MD recommended she add an oral supplement daily. Encouraged her to choose high protein source and informed of options generic brand which are less expensive and contain similar nutrient profile.   Weight hx: patient reports usual weight 100-110 lb for at least 6 months. Currently at 100 lbs. Suspect severe chronic malnutrition given her history of respiratory disease, underweight status and poor oral intake.    Medications reviewed and include:  Ensure , Prostat, Prozac, Seroquel, Nicotine, Zithromax, rocephin (PNA)  Labs: BMP Latest Ref Rng & Units 04/26/2018 04/25/2018 11/17/2015  Glucose 70 - 99 mg/dL 122(H) 105(H) 114(H)  BUN 8 - 23 mg/dL 8 7(L) <5(L)  Creatinine 0.44 - 1.00 mg/dL 0.48 0.47 0.43(L)  Sodium 135 - 145 mmol/L 132(L) 131(L) 136  Potassium 3.5 - 5.1 mmol/L 3.8 2.4(LL) 3.9  Chloride 98 - 111 mmol/L 99 93(L) 105  CO2 22 - 32 mmol/L 24 26 24   Calcium 8.9 - 10.3 mg/dL 7.7(L) 7.8(L) 8.0(L)     NUTRITION - FOCUSED PHYSICAL EXAM:  Unable to complete Nutrition-Focused physical exam at this time.     Diet Order:   Diet Order            Diet Heart Room service appropriate? Yes; Fluid consistency: Thin  Diet effective now              EDUCATION NEEDS:  Education needs have been addressed    Skin:  Skin Assessment: Skin Integrity Issues: Skin Integrity Issues:: Stage I Stage I: sacrum  Last BM:  4/21   Height:   Ht Readings from Last 1 Encounters:  04/25/18 5\' 8"  (1.727 m)    Weight:   Wt Readings from Last 1 Encounters:  04/25/18 45.4 kg    Ideal Body Weight:  64 kg  BMI:  Body mass index is 15.2 kg/m.  Estimated Nutritional Needs:   Kcal:  7035-0093 (30-35 kcal/kg/bw)  Protein:  59-68 gr (1.3-1.5 gr/kg/bw)  Fluid:  1400 ml daily   Colman Cater MS,RD,CSG,LDN Office: [phone] Pager: #  [phone]   

## 2018-04-26 NOTE — Progress Notes (Signed)
PROGRESS NOTE    Jennifer Stevens  GGY:[id number] DOB: 1955/06/01 DOA: 04/25/2018 PCP: Patient, No Pcp Per    Brief Narrative:  63 year old female who presented with dyspnea.  She does have significant past medical history for COPD, tobacco abuse, PTSD, anxiety and chronic back pain.  She reported 4 weeks of persistent dyspnea with productive cough and associated with precordial chest pain. She was treated as an outpatient with levofloxacin for 7 days with no improvement in her symptoms.  On her initial physical examination she was afebrile, heart rate 78, respiratory rate 24, blood pressure 123/82, oxygen saturation was 86% on room air, her lungs had decreased breath sounds at left base, positive rales but no wheezing, heart S1-S2 present and rhythmic, abdomen was soft nontender, no lower extremity edema.  Her chest x-ray showed a left lower lobe opacity, CT chest showed a 3.4 x 3.0 cm left hilar mass with postobstructive atelectasis/pneumonia within the lingula and left lower lobe.  1.4 cm collection of air within the left lower lobe consolidation, concerning for small lung abscess.  Trace pleural effusion on the left.  EKG had 90 bpm, normal axis, normal intervals, no significant ST segment or T wave changes  Patient was admitted to the hospital working diagnosis of acute hypoxic respiratory failure due to left lower lobe/lingular pneumonia.   Assessment & Plan:   Principal Problem:   Pneumonia Active Problems:   PTSD (post-traumatic stress disorder)   Anxiety   Tobacco use disorder   Elevated troponin   Hypokalemia   Severe protein-calorie malnutrition (HCC)   Pressure injury of skin  1. Left lower lobe/ lingula pneumonia/ present on admission. Chest CT suggestive for post obstructive pneumonia. Will continue antibiotic therapy with ceftriaxone and azithromycin. Oxymetry monitoring and supplemental 02 per [place]. Patient will need pulmonary evaluation to schedule diagnostic bronchoscopy.  Patient with significant weight loss.   2. Tobacco abuse. Smoking cessation.  3. Hypokalemia. Continue K correction with Kcl, follow on renal panel in am.  4. Severe protein calorie malnutrition. Continue nutritional supplements.   5. Skin pressure ulcer present on admission. Sacrum stage 1.   6. Anxiety. Continue alprazolam, fluoxetine, trazodone and quetiapine.   7. COPD. No signs of exacerbation, continue Breo and albuterol.   DVT prophylaxis: enoxaparin   Code Status: full Family Communication: no family at the bedside  Disposition Plan/ discharge barriers: pending clinical improvement.   Body mass index is 15.2 kg/m. Malnutrition Type:  Nutrition Problem: Increased nutrient needs Etiology: chronic illness(acute on chronic respiratory disease)   Malnutrition Characteristics:  Signs/Symptoms: estimated needs, energy intake < or equal to 75% for > or equal to 1 month   Nutrition Interventions:  Interventions: Ensure Enlive (each supplement provides 350kcal and 20 grams of protein), Prostat, MVI  RN Pressure Injury Documentation: Pressure Injury 04/25/18 Stage I -  Intact skin with non-blanchable redness of a localized area usually over a bony prominence. (Active)  04/25/18 2200  Location: Sacrum  Location Orientation: Mid  Staging: Stage I -  Intact skin with non-blanchable redness of a localized area usually over a bony prominence.  Wound Description (Comments):   Present on Admission:      Consultants:     Procedures:     Antimicrobials:   Ceftriaxone and azithromycin    Subjective: Patient reports feeling better but not yet back to baseline, continue to have dyspnea and weakness. Positive cough. No nausea or vomiting. Positive significant recent weight loss.   Objective: Vitals:   04/25/18 2030  04/25/18 2100 04/25/18 2218 04/26/18 0540  BP: 138/89 (!) 142/92 121/85 (!) 131/99  Pulse: 63 75 69 83  Resp: (!) 25 19  18   Temp:   97.8 F (36.6 C)  98.1 F (36.7 C)  TempSrc:   Oral Oral  SpO2: 91% 93% 97% 94%  Weight:      Height:        Intake/Output Summary (Last 24 hours) at 04/26/2018 1310 Last data filed at 04/26/2018 1254 Gross per 24 hour  Intake 1158.75 ml  Output 300 ml  Net 858.75 ml   Filed Weights   04/25/18 1726  Weight: 45.4 kg    Examination:   General: deconditioned  Neurology: Awake and alert, non focal  E ENT: mild pallor, no icterus, oral mucosa moist Cardiovascular: No JVD. S1-S2 present, rhythmic, no gallops, rubs, or murmurs. No lower extremity edema. Pulmonary: positive breath sounds bilaterally, decreased air movement, no wheezing, rhonchi, left base rales. Gastrointestinal. Abdomen with no organomegaly, non tender, no rebound or guarding Skin. No rashes Musculoskeletal: no joint deformities     Data Reviewed: I have personally reviewed following labs and imaging studies  CBC: Recent Labs  Lab 04/25/18 1836 04/26/18 0442  WBC 12.0* 6.8  NEUTROABS 9.7*  --   HGB 9.9* 9.0*  HCT 31.1* 29.4*  MCV 82.5 83.5  PLT 453* 937*   Basic Metabolic Panel: Recent Labs  Lab 04/25/18 1836 04/26/18 0442  NA 131* 132*  K 2.4* 3.8  CL 93* 99  CO2 26 24  GLUCOSE 105* 122*  BUN 7* 8  CREATININE 0.47 0.48  CALCIUM 7.8* 7.7*  MG 1.9  --    GFR: Estimated Creatinine Clearance: 52.9 mL/min (by C-G formula based on SCr of 0.48 mg/dL). Liver Function Tests: Recent Labs  Lab 04/25/18 1836 04/26/18 0442  AST 15 13*  ALT 11 9  ALKPHOS 99 87  BILITOT 0.3 0.2*  PROT 6.4* 5.9*  ALBUMIN 2.4* 2.3*   No results for input(s): LIPASE, AMYLASE in the last 168 hours. No results for input(s): AMMONIA in the last 168 hours. Coagulation Profile: No results for input(s): INR, PROTIME in the last 168 hours. Cardiac Enzymes: Recent Labs  Lab 04/25/18 1836 04/25/18 2227 04/26/18 0442 04/26/18 1012  TROPONINI 0.06* 0.04* 0.03* 0.03*   BNP (last 3 results) No results for input(s): PROBNP in the  last 8760 hours. HbA1C: No results for input(s): HGBA1C in the last 72 hours. CBG: No results for input(s): GLUCAP in the last 168 hours. Lipid Profile: No results for input(s): CHOL, HDL, LDLCALC, TRIG, CHOLHDL, LDLDIRECT in the last 72 hours. Thyroid Function Tests: No results for input(s): TSH, T4TOTAL, FREET4, T3FREE, THYROIDAB in the last 72 hours. Anemia Panel: Recent Labs    04/25/18 2227  VITAMINB12 510  FERRITIN 258  TIBC 227*  IRON 15*      Radiology Studies: I have reviewed all of the imaging during this hospital visit personally     Scheduled Meds: . enoxaparin (LOVENOX) injection  40 mg Subcutaneous Q24H  . feeding supplement (ENSURE ENLIVE)  237 mL Oral BID BM  . feeding supplement (PRO-STAT SUGAR FREE 64)  30 mL Oral BID  . FLUoxetine  20 mg Oral q morning - 10a  . fluticasone furoate-vilanterol  1 puff Inhalation Daily  . gabapentin  300 mg Oral TID  . mouth rinse  15 mL Mouth Rinse BID  . multivitamin with minerals  1 tablet Oral Daily  . nicotine  21 mg Transdermal Daily  .  QUEtiapine  300 mg Oral BID  . traZODone  300 mg Oral QHS   Continuous Infusions: . azithromycin 500 mg (04/25/18 2012)  . cefTRIAXone (ROCEPHIN)  IV       LOS: 1 day        Montreal Steidle Gerome Apley, MD

## 2018-04-27 LAB — BASIC METABOLIC PANEL
Anion gap: 5 (ref 5–15)
BUN: 9 mg/dL (ref 8–23)
CO2: 24 mmol/L (ref 22–32)
Calcium: 7.5 mg/dL — ABNORMAL LOW (ref 8.9–10.3)
Chloride: 104 mmol/L (ref 98–111)
Creatinine, Ser: 0.47 mg/dL (ref 0.44–1.00)
GFR calc Af Amer: >60 mL/min (ref >60)
GFR calc non Af Amer: >60 mL/min (ref >60)
Glucose, Bld: 86 mg/dL (ref 70–99)
Potassium: 3.5 mmol/L (ref 3.5–5.1)
Sodium: 133 mmol/L — ABNORMAL LOW (ref 135–145)

## 2018-04-27 LAB — LEGIONELLA PNEUMOPHILA SEROGP 1 UR AG: L. pneumophila Serogp 1 Ur Ag: NEGATIVE

## 2018-04-27 LAB — CBC WITH DIFFERENTIAL/PLATELET
Abs Immature Granulocytes: 0.06 10*3/uL (ref 0.00–0.07)
Basophils Absolute: 0 10*3/uL (ref 0.0–0.1)
Basophils Relative: 0 %
Eosinophils Absolute: 0 10*3/uL (ref 0.0–0.5)
Eosinophils Relative: 0 %
HCT: 27.3 % — ABNORMAL LOW (ref 36.0–46.0)
Hemoglobin: 8 g/dL — ABNORMAL LOW (ref 12.0–15.0)
Immature Granulocytes: 1 %
Lymphocytes Relative: 9 %
Lymphs Abs: 0.7 10*3/uL (ref 0.7–4.0)
MCH: 26.1 pg (ref 26.0–34.0)
MCHC: 29.3 g/dL — ABNORMAL LOW (ref 30.0–36.0)
MCV: 88.9 fL (ref 80.0–100.0)
Monocytes Absolute: 0.5 10*3/uL (ref 0.1–1.0)
Monocytes Relative: 7 %
Neutro Abs: 6.3 10*3/uL (ref 1.7–7.7)
Neutrophils Relative %: 83 %
Platelets: 385 10*3/uL (ref 150–400)
RBC: 3.07 MIL/uL — ABNORMAL LOW (ref 3.87–5.11)
RDW: 17.2 % — ABNORMAL HIGH (ref 11.5–15.5)
WBC: 7.6 10*3/uL (ref 4.0–10.5)
nRBC: 0 % (ref 0.0–0.2)

## 2018-04-27 LAB — FOLATE RBC
Folate, Hemolysate: 341 ng/mL
Folate, RBC: 1317 ng/mL (ref >498)
Hematocrit: 25.9 % — ABNORMAL LOW (ref 34.0–46.6)

## 2018-04-27 MED ORDER — LOPERAMIDE HCL 2 MG PO CAPS
2.0000 mg | ORAL_CAPSULE | ORAL | Status: DC | PRN
  Administered 2018-04-27: 10:00:00 2 mg via ORAL
  Filled 2018-04-27: qty 1

## 2018-04-27 MED ORDER — SODIUM CHLORIDE 0.9 % IV BOLUS
500.0000 mL | Freq: Once | INTRAVENOUS | Status: AC
  Administered 2018-04-27: 23:00:00 500 mL via INTRAVENOUS

## 2018-04-27 MED ORDER — SODIUM CHLORIDE 0.9 % IV BOLUS
500.0000 mL | Freq: Once | INTRAVENOUS | Status: AC
  Administered 2018-04-27: 500 mL via INTRAVENOUS

## 2018-04-27 MED ORDER — LOPERAMIDE HCL 2 MG PO CAPS
2.0000 mg | ORAL_CAPSULE | Freq: Once | ORAL | Status: AC
  Administered 2018-04-27: 2 mg via ORAL
  Filled 2018-04-27: qty 1

## 2018-04-27 MED ORDER — GUAIFENESIN ER 600 MG PO TB12
600.0000 mg | ORAL_TABLET | Freq: Two times a day (BID) | ORAL | Status: DC
  Administered 2018-04-27 – 2018-04-30 (×7): 600 mg via ORAL
  Filled 2018-04-27 (×7): qty 1

## 2018-04-27 MED ORDER — BUDESONIDE 0.25 MG/2ML IN SUSP
0.2500 mg | Freq: Two times a day (BID) | RESPIRATORY_TRACT | Status: DC
  Administered 2018-04-27 – 2018-04-30 (×5): 0.25 mg via RESPIRATORY_TRACT
  Filled 2018-04-27 (×5): qty 2

## 2018-04-27 MED ORDER — GUAIFENESIN-DM 100-10 MG/5ML PO SYRP
5.0000 mL | ORAL_SOLUTION | ORAL | Status: DC | PRN
  Administered 2018-04-27: 10:00:00 5 mL via ORAL
  Filled 2018-04-27: qty 5

## 2018-04-27 NOTE — Evaluation (Signed)
Physical Therapy Evaluation Patient Details Name: Jennifer Stevens MRN: [id number] DOB: 1955/10/03 Today's Date: 04/27/2018   History of Present Illness  Jennifer Stevens  is a 63 y.o. female,w Copd , Chronic Nicotine Dependence, PTSD/ Anxiety, Chronic back pain, who apparently presents with c/o dyspnea and productive cough (yellow) for the past 4 weeks.  Pt was apparently seen by her PCP and treated w levaquin about 1 week ago without relief.  Pt states her breathing was worse and therefore presented to ED.  Pt fell on side about 4 days ago and has left lower chest discomfort "points to left upper abdomen" since then. No radiation.  Nothing appears to make better or worse in particular.  Pt has tried albuterol and advair at home without relief.   Pt denies fever, chills, palp, n/v, diarrhea, brbpr, black stool.  Pt denies any recent travel, any covid exposure, or any sick contact.       Clinical Impression  Patient functioning near baseline for functional mobility and gait, other than c/o generalized weakness possibly due to diarrhea per patient.  Patient demonstrates labored movement for sitting up at bedside, sit to stands and transferring to commode in bathroom requiring use of grab bar.  Patient has tendency to lean on nearby objects for support when using SPC, had patient use RW for ambulation resulting in less unsteadiness and reliance on nearby objects for support.  Patient declined to sit up in chair after therapy due to fatigue and demonstrated good return for getting into and repositioning self in bed.  Patient will benefit from continued physical therapy in hospital and recommended venue below to increase strength, balance, endurance for safe ADLs and gait.     Follow Up Recommendations Home health PT;Supervision - Intermittent;Supervision for mobility/OOB    Equipment Recommendations  Rolling walker with 5" wheels;3in1 (PT)(shower chair)    Recommendations for Other Services        Precautions / Restrictions Precautions Precautions: Fall Restrictions Weight Bearing Restrictions: No      Mobility  Bed Mobility Overal bed mobility: Needs Assistance Bed Mobility: Supine to Sit;Sit to Supine     Supine to sit: Min guard;Min assist Sit to supine: Supervision   General bed mobility comments: increased time, difficulty sitting up due to generalized weakness  Transfers Overall transfer level: Needs assistance Equipment used: Straight cane;Rolling walker (2 wheeled) Transfers: Sit to/from [facility] Sit to Stand: Min guard;Min assist Stand pivot transfers: Min assist       General transfer comment: slow labored movement with tendency to lean on nearby objects for support when using SPC, safer using RW  Ambulation/Gait Ambulation/Gait assistance: Min guard;Min assist Gait Distance (Feet): 35 Feet Assistive device: Rolling walker (2 wheeled) Gait Pattern/deviations: Decreased step length - right;Decreased step length - left;Decreased stride length Gait velocity: decreased   General Gait Details: slow labored cadence with tendency to lean on nearby objects for support when using SPC, used RW for ambulation to improve balance/stability, no loss of balance in hallway, but limited due to c/o fatigue  Stairs            Wheelchair Mobility    Modified Rankin (Stroke Patients Only)       Balance Overall balance assessment: Needs assistance Sitting-balance support: Feet supported;No upper extremity supported Sitting balance-Leahy Scale: Good     Standing balance support: Single extremity supported;During functional activity Standing balance-Leahy Scale: Poor Standing balance comment: fair/poor with SPC, fair using RW  Pertinent Vitals/Pain Pain Assessment: No/denies pain    Home Living Family/patient expects to be discharged to:: Private residence Living Arrangements:  Non-relatives/Friends Available Help at Discharge: Friend(s) Type of Home: Apartment Home Access: Stairs to enter Entrance Stairs-Rails: Left Entrance Stairs-Number of Steps: 12 Home Layout: One level Home Equipment: Cane - single point      Prior Function Level of Independence: Needs assistance   Gait / Transfers Assistance Needed: Supervised/Min guard assist household ambulator  ADL's / Homemaking Assistance Needed: assisted by freinds        Hand Dominance        Extremity/Trunk Assessment   Upper Extremity Assessment Upper Extremity Assessment: Generalized weakness    Lower Extremity Assessment Lower Extremity Assessment: Generalized weakness    Cervical / Trunk Assessment Cervical / Trunk Assessment: Normal  Communication   Communication: No difficulties  Cognition Arousal/Alertness: Awake/alert Behavior During Therapy: WFL for tasks assessed/performed Overall Cognitive Status: Within Functional Limits for tasks assessed                                        General Comments      Exercises     Assessment/Plan    PT Assessment Patient needs continued PT services  PT Problem List Decreased strength;Decreased activity tolerance;Decreased balance;Decreased mobility       PT Treatment Interventions Therapeutic exercise;Gait training;Stair training;Functional mobility training;Therapeutic activities;Patient/family education    PT Goals (Current goals can be found in the Care Plan section)  Acute Rehab PT Goals Patient Stated Goal: return home with friends to assist PT Goal Formulation: With patient Time For Goal Achievement: 04/30/18 Potential to Achieve Goals: Good    Frequency Min 3X/week   Barriers to discharge        Co-evaluation               AM-PAC PT "6 Clicks" Mobility  Outcome Measure Help needed turning from your back to your side while in a flat bed without using bedrails?: None Help needed moving from lying  on your back to sitting on the side of a flat bed without using bedrails?: A Little Help needed moving to and from a bed to a chair (including a wheelchair)?: A Little Help needed standing up from a chair using your arms (e.g., wheelchair or bedside chair)?: A Little Help needed to walk in hospital room?: A Little Help needed climbing 3-5 steps with a railing? : A Lot 6 Click Score: 18    End of Session   Activity Tolerance: Patient tolerated treatment well;Patient limited by fatigue Patient left: in bed;with call bell/phone within reach Nurse Communication: Mobility status PT Visit Diagnosis: Unsteadiness on feet (R26.81);Other abnormalities of gait and mobility (R26.89);Muscle weakness (generalized) (M62.81)    Time: 4680-3212 PT Time Calculation (min) (ACUTE ONLY): 34 min   Charges:   PT Evaluation $PT Eval Moderate Complexity: 1 Mod PT Treatments $Therapeutic Activity: 23-37 mins        9:37 AM, 04/27/18 Lonell Grandchild, MPT Physical Therapist with [facility] [phone] office [phone] mobile phone

## 2018-04-27 NOTE — Plan of Care (Signed)
  Problem: Education: Goal: Knowledge of General Education information will improve Description Including pain rating scale, medication(s)/side effects and non-pharmacologic comfort measures 04/27/2018 1113 by Rance Muir, RN Outcome: Progressing 04/27/2018 0907 by Rance Muir, RN Outcome: Progressing   Problem: Health Behavior/Discharge Planning: Goal: Ability to manage health-related needs will improve 04/27/2018 1113 by Rance Muir, RN Outcome: Progressing 04/27/2018 0907 by Rance Muir, RN Outcome: Progressing   Problem: Clinical Measurements: Goal: Ability to maintain clinical measurements within normal limits will improve 04/27/2018 1113 by Rance Muir, RN Outcome: Progressing 04/27/2018 0907 by Rance Muir, RN Outcome: Progressing Goal: Will remain free from infection 04/27/2018 1113 by Rance Muir, RN Outcome: Progressing 04/27/2018 0907 by Rance Muir, RN Outcome: Progressing Goal: Diagnostic test results will improve 04/27/2018 1113 by Rance Muir, RN Outcome: Progressing 04/27/2018 0907 by Rance Muir, RN Outcome: Progressing Goal: Respiratory complications will improve 04/27/2018 1113 by Rance Muir, RN Outcome: Progressing 04/27/2018 0907 by Rance Muir, RN Outcome: Progressing Goal: Cardiovascular complication will be avoided 04/27/2018 1113 by Rance Muir, RN Outcome: Progressing 04/27/2018 0907 by Rance Muir, RN Outcome: Progressing   Problem: Activity: Goal: Risk for activity intolerance will decrease 04/27/2018 1113 by Rance Muir, RN Outcome: Progressing 04/27/2018 0907 by Rance Muir, RN Outcome: Progressing   Problem: Nutrition: Goal: Adequate nutrition will be maintained 04/27/2018 1113 by Rance Muir, RN Outcome: Progressing 04/27/2018 0907 by Rance Muir, RN Outcome: Progressing   Problem: Coping: Goal: Level of anxiety will decrease 04/27/2018 1113 by Rance Muir, RN Outcome: Progressing 04/27/2018 0907 by Rance Muir, RN Outcome: Progressing   Problem: Elimination: Goal: Will not  experience complications related to bowel motility 04/27/2018 1113 by Rance Muir, RN Outcome: Progressing 04/27/2018 0907 by Rance Muir, RN Outcome: Progressing Goal: Will not experience complications related to urinary retention 04/27/2018 1113 by Rance Muir, RN Outcome: Progressing 04/27/2018 0907 by Rance Muir, RN Outcome: Progressing   Problem: Pain Managment: Goal: General experience of comfort will improve 04/27/2018 1113 by Rance Muir, RN Outcome: Progressing 04/27/2018 0907 by Rance Muir, RN Outcome: Progressing   Problem: Safety: Goal: Ability to remain free from injury will improve 04/27/2018 1113 by Rance Muir, RN Outcome: Progressing 04/27/2018 0907 by Rance Muir, RN Outcome: Progressing   Problem: Skin Integrity: Goal: Risk for impaired skin integrity will decrease 04/27/2018 1113 by Rance Muir, RN Outcome: Progressing 04/27/2018 0907 by Rance Muir, RN Outcome: Progressing   Problem: Activity: Goal: Ability to tolerate increased activity will improve 04/27/2018 1113 by Rance Muir, RN Outcome: Progressing 04/27/2018 0907 by Rance Muir, RN Outcome: Progressing   Problem: Clinical Measurements: Goal: Ability to maintain a body temperature in the normal range will improve 04/27/2018 1113 by Rance Muir, RN Outcome: Progressing 04/27/2018 0907 by Rance Muir, RN Outcome: Progressing   Problem: Respiratory: Goal: Ability to maintain adequate ventilation will improve 04/27/2018 1113 by Rance Muir, RN Outcome: Progressing 04/27/2018 0907 by Rance Muir, RN Outcome: Progressing Goal: Ability to maintain a clear airway will improve 04/27/2018 1113 by Rance Muir, RN Outcome: Progressing 04/27/2018 0907 by Rance Muir, RN Outcome: Progressing

## 2018-04-27 NOTE — Care Management Important Message (Signed)
Important Message  Patient Details  Name: Jennifer Stevens MRN: [id number] Date of Birth: 1955-12-21   Medicare Important Message Given:  Yes    Tommy Medal 04/27/2018, 1:23 PM

## 2018-04-27 NOTE — Plan of Care (Signed)
  Problem: Acute Rehab PT Goals(only PT should resolve) Goal: Pt Will Go Supine/Side To Sit Outcome: Progressing Flowsheets (Taken 04/27/2018 0940) Pt will go Supine/Side to Sit: with modified independence Goal: Patient Will Transfer Sit To/From Stand Outcome: Progressing Flowsheets (Taken 04/27/2018 0940) Patient will transfer sit to/from stand: with min guard assist Goal: Pt Will Transfer Bed To Chair/Chair To Bed Outcome: Progressing Flowsheets (Taken 04/27/2018 0940) Pt will Transfer Bed to Chair/Chair to Bed: min guard assist Goal: Pt Will Ambulate Outcome: Progressing Flowsheets (Taken 04/27/2018 0940) Pt will Ambulate: 50 feet; with min guard assist; with rolling walker; with cane   9:41 AM, 04/27/18 Lonell Grandchild, MPT Physical Therapist with [facility] [phone] office [phone] mobile phone

## 2018-04-27 NOTE — TOC Initial Note (Addendum)
Transition of Care [facility]) - Initial/Assessment Note    Patient Details  Name: Jennifer Stevens MRN: [id number] Date of Birth: 1955/09/03  Transition of Care [facility] CM/SW Contact:    Jennifer Stevens, Jennifer Reading, RN Phone Number: 04/27/2018, 9:56 AM  Clinical Narrative:           Pneumonia. From home with a friend. Independent. Walks with a cane. Acutely on oxygen, uses inhaler at home. Has PCP, Jennifer Stevens of [place] and daughter help with transportation to appointments. No issues affording medications.  Seen by PT, home health PT, RW, BSC and shower chair recommended. Discussed with patient, she declines home health. She has had it in the past, reports she can do exercises in home herself. Agreeable to DME. Would like 3 in 1 and RW.  No preference on DME provider. Referral given to [facility].          Expected Discharge Plan: Home/Self Care Barriers to Discharge: No Barriers Identified   Patient Goals and CMS Choice Patient states their goals for this hospitalization and ongoing recovery are:: to get home ASAP CMS Medicare.gov Compare Post Acute Care list provided to:: Patient Choice offered to / list presented to : Patient  Expected Discharge Plan and Services Expected Discharge Plan: Home/Self Care   Discharge Planning Services: CM Consult Post Acute Care Choice: [place] arrangements for the past 2 months: Single Family Home                 DME Arranged: Walker rolling, Bedside commode DME Agency: [facility] HH Arranged: Refused HH    Prior Living Arrangements/Services Living arrangements for the past 2 months: Single Family Home Lives with:: Friends   Do you feel safe going back to the place where you live?: Yes      Need for Family Participation in Patient Care: Yes (Comment) Care giver support system in place?: Yes (comment) Current home services: DME(cane) Criminal Activity/Legal Involvement Pertinent to Current  Situation/Hospitalization: No - Comment as needed  Activities of Daily Living Home Assistive Devices/Equipment: None ADL Screening (condition at time of admission) Patient's cognitive ability adequate to safely complete daily activities?: Yes Is the patient deaf or have difficulty hearing?: No Does the patient have difficulty seeing, even when wearing glasses/contacts?: No Does the patient have difficulty concentrating, remembering, or making decisions?: No Patient able to express need for assistance with ADLs?: Yes Does the patient have difficulty dressing or bathing?: No Independently performs ADLs?: Yes (appropriate for developmental age) Does the patient have difficulty walking or climbing stairs?: No Weakness of Legs: Both Weakness of Arms/Hands: None  Permission Sought/Granted Permission sought to share information with : Case Manager Permission granted to share information with : Yes, Verbal Permission Granted     Permission granted to share info w AGENCY: [facility]        Emotional Assessment   Attitude/Demeanor/Rapport: Engaged, Self-Confident Affect (typically observed): Accepting, Calm Orientation: : Oriented to Self, Oriented to Place, Oriented to  Time Alcohol / Substance Use: Not Applicable Psych Involvement: No (comment)  Admission diagnosis:  Hypokalemia [E87.6] Community acquired pneumonia of left lung, unspecified part of lung [J18.9] Patient Active Problem List   Diagnosis Date Noted  . Pressure injury of skin 04/26/2018  . Pneumonia 04/25/2018  . Elevated troponin 04/25/2018  . Hypokalemia 04/25/2018  . Severe protein-calorie malnutrition ([place]) 04/25/2018  . Closed displaced fracture of right femoral neck ([place]) 11/15/2015  . Closed right hip fracture ([place]) 11/13/2015  .  Tobacco use disorder 11/13/2015  . Seizures ([place])   . PTSD (post-traumatic stress disorder)   . Anxiety   . Closed fracture of right hip ([place])   . Fall   . Preop examination     PCP:  Patient, No Pcp Per Pharmacy:   [facility] [place]  [id number] Phone: [phone] Fax: [phone]  [id number] [place], [address] [place] [address] Phone: [phone] Fax: [phone]      Readmission Risk Interventions No flowsheet data found.

## 2018-04-27 NOTE — Progress Notes (Signed)
PROGRESS NOTE    Jennifer Stevens  YTK:[id number] DOB: [birth year] DOA: 04/25/2018 PCP: Christain Sacramento, MD   Brief Narrative:  Per HPI: 63 year old female who presented with dyspnea.  She does have significant past medical history for COPD, tobacco abuse, PTSD, anxiety and chronic back pain.  She reported 4 weeks of persistent dyspnea with productive cough and associated with precordial chest pain. She was treated as an outpatient with levofloxacin for 7 days with no improvement in her symptoms.  On her initial physical examination she was afebrile, heart rate 78, respiratory rate 24, blood pressure 123/82, oxygen saturation was 86% on room air, her lungs had decreased breath sounds at left base, positive rales but no wheezing, heart S1-S2 present and rhythmic, abdomen was soft nontender, no lower extremity edema.  Her chest x-ray showed a left lower lobe opacity, CT chest showed a 3.4 x 3.0 cm left hilar mass with postobstructive atelectasis/pneumonia within the lingula and left lower lobe.  1.4 cm collection of air within the left lower lobe consolidation, concerning for small lung abscess.  Trace pleural effusion on the left.  EKG had 90 bpm, normal axis, normal intervals, no significant ST segment or T wave changes  Patient was admitted to the hospital working diagnosis of acute hypoxic respiratory failure due to left lower lobe/lingular pneumonia.  She continues to have persistent symptoms this morning along with some diarrhea overnight.  Assessment & Plan:   Principal Problem:   Pneumonia Active Problems:   PTSD (post-traumatic stress disorder)   Anxiety   Tobacco use disorder   Elevated troponin   Hypokalemia   Severe protein-calorie malnutrition (HCC)   Pressure injury of skin   1. Left lower lobe/ lingula pneumonia/ present on admission. Chest CT suggestive for post obstructive pneumonia.  Consult to pulmonology placed for review of imaging and further recommendations with plans  for outpatient management.  Will continue antibiotic therapy with ceftriaxone and azithromycin. Oxymetry monitoring and supplemental 02 per [place]-currently room air.  Appreciate any further pulmonology recommendations.  Added Mucinex, Robitussin, and Pulmicort today.  2. Tobacco abuse. Smoking cessation counseling provided.  3. Hypokalemia-resolved.  Follow-up labs in a.m.  Maintain on current IV fluid.  4. Severe protein calorie malnutrition. Continue nutritional supplements.   5. Skin pressure ulcer present on admission. Sacrum stage 1.   6. Anxiety. Continue alprazolam, fluoxetine, trazodone and quetiapine.   7. COPD. No signs of exacerbation, continue Breo and albuterol.   8. Diarrhea.  Imodium initiated.  I do not suspect any C. difficile or other issues at this point in time.  Will anticipate hopeful improvement soon.  Maintain on IV fluid as well as current diet.  DVT prophylaxis: enoxaparin   Code Status: full Family Communication: no family at the bedside  Disposition Plan/ discharge barriers: pending clinical improvement.   Body mass index is 15.2 kg/m. Malnutrition Type:  Nutrition Problem: Increased nutrient needs Etiology: chronic illness(acute on chronic respiratory disease)   Malnutrition Characteristics:  Signs/Symptoms: estimated needs, energy intake < or equal to 75% for > or equal to 1 month   Nutrition Interventions:  Interventions: Ensure Enlive (each supplement provides 350kcal and 20 grams of protein), Prostat, MVI  RN Pressure Injury Documentation: Pressure Injury 04/25/18 Stage I -  Intact skin with non-blanchable redness of a localized area usually over a bony prominence. (Active)  04/25/18 2200  Location: Sacrum  Location Orientation: Mid  Staging: Stage I -  Intact skin with non-blanchable redness of a localized area usually  over a bony prominence.  Wound Description (Comments):   Present on Admission:      Consultants:    Pulmonology consulted 4/22  Procedures:   None  Antimicrobials:   Ceftriaxone and azithromycin    Subjective: Patient seen and evaluated today with new complaints of loose, liquidy stools with 5 BMs overnight.  She continues to have persistent dry cough that is nonproductive in nature.  No other acute events otherwise noted.  Objective: Vitals:   04/26/18 2124 04/26/18 2205 04/27/18 0528 04/27/18 0727  BP:  92/72 95/76   Pulse:  76 82   Resp:  20 20   Temp:   98.1 F (36.7 C)   TempSrc:   Oral   SpO2: 92% 91% 90% 93%  Weight:      Height:        Intake/Output Summary (Last 24 hours) at 04/27/2018 1135 Last data filed at 04/27/2018 0314 Gross per 24 hour  Intake 1256.84 ml  Output --  Net 1256.84 ml   Filed Weights   04/25/18 1726  Weight: 45.4 kg    Examination:  General exam: Appears calm and comfortable  Respiratory system: Clear to auscultation. Respiratory effort normal.  Currently on room air. Cardiovascular system: S1 & S2 heard, RRR. No JVD, murmurs, rubs, gallops or clicks. No pedal edema. Gastrointestinal system: Abdomen is nondistended, soft and nontender. No organomegaly or masses felt. Normal bowel sounds heard. Central nervous system: Alert and oriented. No focal neurological deficits. Extremities: Symmetric 5 x 5 power. Skin: No rashes, lesions or ulcers Psychiatry: Judgement and insight appear normal. Mood & affect appropriate.     Data Reviewed: I have personally reviewed following labs and imaging studies  CBC: Recent Labs  Lab 04/25/18 1836 04/26/18 0442 04/27/18 0509  WBC 12.0* 6.8 7.6  NEUTROABS 9.7*  --  6.3  HGB 9.9* 9.0* 8.0*  HCT 31.1* 29.4* 27.3*  MCV 82.5 83.5 88.9  PLT 453* 474* 315   Basic Metabolic Panel: Recent Labs  Lab 04/25/18 1836 04/26/18 0442 04/27/18 0509  NA 131* 132* 133*  K 2.4* 3.8 3.5  CL 93* 99 104  CO2 26 24 24   GLUCOSE 105* 122* 86  BUN 7* 8 9  CREATININE 0.47 0.48 0.47  CALCIUM 7.8*  7.7* 7.5*  MG 1.9  --   --    GFR: Estimated Creatinine Clearance: 52.9 mL/min (by C-G formula based on SCr of 0.47 mg/dL). Liver Function Tests: Recent Labs  Lab 04/25/18 1836 04/26/18 0442  AST 15 13*  ALT 11 9  ALKPHOS 99 87  BILITOT 0.3 0.2*  PROT 6.4* 5.9*  ALBUMIN 2.4* 2.3*   No results for input(s): LIPASE, AMYLASE in the last 168 hours. No results for input(s): AMMONIA in the last 168 hours. Coagulation Profile: No results for input(s): INR, PROTIME in the last 168 hours. Cardiac Enzymes: Recent Labs  Lab 04/25/18 1836 04/25/18 2227 04/26/18 0442 04/26/18 1012  TROPONINI 0.06* 0.04* 0.03* 0.03*   BNP (last 3 results) No results for input(s): PROBNP in the last 8760 hours. HbA1C: No results for input(s): HGBA1C in the last 72 hours. CBG: No results for input(s): GLUCAP in the last 168 hours. Lipid Profile: No results for input(s): CHOL, HDL, LDLCALC, TRIG, CHOLHDL, LDLDIRECT in the last 72 hours. Thyroid Function Tests: No results for input(s): TSH, T4TOTAL, FREET4, T3FREE, THYROIDAB in the last 72 hours. Anemia Panel: Recent Labs    04/25/18 2227  VITAMINB12 510  FERRITIN 258  TIBC 227*  IRON  15*   Sepsis Labs: No results for input(s): PROCALCITON, LATICACIDVEN in the last 168 hours.  Recent Results (from the past 240 hour(s))  SARS Coronavirus 2 [facility] order, Performed in [place] hospital lab)     Status: None   Collection Time: 04/25/18  5:58 PM  Result Value Ref Range Status   SARS Coronavirus 2 NEGATIVE NEGATIVE Final    Comment: (NOTE) If result is NEGATIVE SARS-CoV-2 target nucleic acids are NOT DETECTED. The SARS-CoV-2 RNA is generally detectable in upper and lower  respiratory specimens during the acute phase of infection. The lowest  concentration of SARS-CoV-2 viral copies this assay can detect is 250  copies / mL. A negative result does not preclude SARS-CoV-2 infection  and should not be used as the sole basis for treatment or  other  patient management decisions.  A negative result may occur with  improper specimen collection / handling, submission of specimen other  than nasopharyngeal swab, presence of viral mutation(s) within the  areas targeted by this assay, and inadequate number of viral copies  (<250 copies / mL). A negative result must be combined with clinical  observations, patient history, and epidemiological information. If result is POSITIVE SARS-CoV-2 target nucleic acids are DETECTED. The SARS-CoV-2 RNA is generally detectable in upper and lower  respiratory specimens dur ing the acute phase of infection.  Positive  results are indicative of active infection with SARS-CoV-2.  Clinical  correlation with patient history and other diagnostic information is  necessary to determine patient infection status.  Positive results do  not rule out bacterial infection or co-infection with other viruses. If result is PRESUMPTIVE POSTIVE SARS-CoV-2 nucleic acids MAY BE PRESENT.   A presumptive positive result was obtained on the submitted specimen  and confirmed on repeat testing.  While 2019 novel coronavirus  (SARS-CoV-2) nucleic acids may be present in the submitted sample  additional confirmatory testing may be necessary for epidemiological  and / or clinical management purposes  to differentiate between  SARS-CoV-2 and other Sarbecovirus currently known to infect humans.  If clinically indicated additional testing with an alternate test  methodology [phone]) is advised. The SARS-CoV-2 RNA is generally  detectable in upper and lower respiratory sp ecimens during the acute  phase of infection. The expected result is Negative. Fact Sheet for Patients:  StrictlyIdeas.no Fact Sheet for Healthcare Providers: BankingDealers.co.za This test is not yet approved or cleared by the [place] FDA and has been authorized for detection and/or diagnosis of  SARS-CoV-2 by FDA under an Emergency Use Authorization (EUA).  This EUA will remain in effect (meaning this test can be used) for the duration of the COVID-19 declaration under Section 564(b)(1) of the Act, 21 U.S.C. section 360bbb-3(b)(1), unless the authorization is terminated or revoked sooner. Performed at [facility], [address] [place], [place] [address]          Radiology Studies: Ct Chest W Contrast  Result Date: 04/25/2018 CLINICAL DATA:  Shortness of breath and productive cough for the past 4 weeks. Left-sided pneumonia on chest x-ray. Heavy smoker. EXAM: CT CHEST WITH CONTRAST TECHNIQUE: Multidetector CT imaging of the chest was performed during intravenous contrast administration. CONTRAST:  91mL OMNIPAQUE IOHEXOL 300 MG/ML  SOLN COMPARISON:  Chest x-ray from same day. FINDINGS: Cardiovascular: Normal heart size. No pericardial effusion. No thoracic aortic aneurysm or dissection. Coronary, aortic arch, and branch vessel atherosclerotic vascular disease. No pulmonary embolism. Mediastinum/Nodes: No enlarged mediastinal, hilar, or axillary lymph nodes. Thyroid gland, trachea, and  esophagus demonstrate no significant findings. Lungs/Pleura: Moderate upper lobe predominant centrilobular emphysema. Ill-defined left hilar mass measuring approximately 3.4 x 3.0 cm with obstruction of the lingular and left lower lobe bronchi. There is volume loss and patchy, nonenhancing consolidation with peribronchovascular opacities in the lingula and left lower lobe. There is a 1.3 x 1.4 cm collection of air within the left lower lobe consolidation. There is left lower lobe bronchiectasis. Trace left pleural effusion. No pneumothorax. Upper Abdomen: No acute abnormality.  Multiple hepatic simple cysts. Musculoskeletal: Acute appearing nondisplaced fractures of the left anterior ninth through eleventh ribs. Subacute fractures of the left fifth through ninth ribs and right sixth and seventh ribs.  Age-indeterminate mild T8 superior endplate compression fracture. Schmorl's nodes involving the T7 and T9 superior endplates. IMPRESSION: 1. Ill-defined 3.4 x 3.0 cm left hilar mass concerning for primary lung cancer, likely amenable to transbronchial biopsy. 2. Postobstructive atelectasis and pneumonia within the lingula and left lower lobe. Small 1.4 cm collection of air within the left lower lobe consolidation is concerning for small lung abscess. 3. Trace left pleural effusion. 4. Acute appearing nondisplaced fractures of the left anterior ninth through eleventh ribs. Subacute fractures of the left fifth through ninth and right sixth and seventh ribs. 5. Age-indeterminate mild T8 superior endplate compression fracture. Correlate with point tenderness. 6.  Emphysema (ICD10-J43.9). 7.  Aortic atherosclerosis (ICD10-I70.0). Electronically Signed   By: Titus Dubin M.D.   On: 04/25/2018 22:07   Dg Chest Portable 1 View  Result Date: 04/25/2018 CLINICAL DATA:  Dyspnea EXAM: PORTABLE CHEST 1 VIEW COMPARISON:  11/13/2015 FINDINGS: Extensive infiltrate in the left lower lobe has developed since the prior study. Volume loss with shift of the mediastinum to the left. Possible small left effusion Right lung is clear.  Negative for heart failure. IMPRESSION: Extensive consolidation and volume loss left lower lobe. Suspect pneumonia Electronically Signed   By: Franchot Gallo M.D.   On: 04/25/2018 18:45        Scheduled Meds:  budesonide (PULMICORT) nebulizer solution  0.25 mg Nebulization BID   enoxaparin (LOVENOX) injection  40 mg Subcutaneous Q24H   feeding supplement (ENSURE ENLIVE)  237 mL Oral BID BM   feeding supplement (PRO-STAT SUGAR FREE 64)  30 mL Oral BID   FLUoxetine  20 mg Oral q morning - 10a   fluticasone furoate-vilanterol  1 puff Inhalation Daily   gabapentin  300 mg Oral TID   guaiFENesin  600 mg Oral BID   mouth rinse  15 mL Mouth Rinse BID   multivitamin with minerals  1  tablet Oral Daily   nicotine  21 mg Transdermal Daily   QUEtiapine  300 mg Oral BID   traZODone  300 mg Oral QHS   Continuous Infusions:  azithromycin 500 mg (04/26/18 2204)   cefTRIAXone (ROCEPHIN)  IV 1 g (04/26/18 2018)   dextrose 5 % and 0.9% NaCl 50 mL/hr at 04/26/18 1704     LOS: 2 days    Time spent: 30 minutes    Karee Christopherson Darleen Crocker, DO [facility] Pager [phone]  If 7PM-7AM, please contact night-coverage [email or web address] Password [facility] 04/27/2018, 11:35 AM

## 2018-04-28 LAB — BASIC METABOLIC PANEL
Anion gap: 7 (ref 5–15)
BUN: 7 mg/dL — ABNORMAL LOW (ref 8–23)
CO2: 22 mmol/L (ref 22–32)
Calcium: 7 mg/dL — ABNORMAL LOW (ref 8.9–10.3)
Chloride: 103 mmol/L (ref 98–111)
Creatinine, Ser: 0.46 mg/dL (ref 0.44–1.00)
GFR calc Af Amer: >60 mL/min (ref >60)
GFR calc non Af Amer: >60 mL/min (ref >60)
Glucose, Bld: 95 mg/dL (ref 70–99)
Potassium: 2.8 mmol/L — ABNORMAL LOW (ref 3.5–5.1)
Sodium: 132 mmol/L — ABNORMAL LOW (ref 135–145)

## 2018-04-28 LAB — CBC
HCT: 25.3 % — ABNORMAL LOW (ref 36.0–46.0)
Hemoglobin: 7.9 g/dL — ABNORMAL LOW (ref 12.0–15.0)
MCH: 26.6 pg (ref 26.0–34.0)
MCHC: 31.2 g/dL (ref 30.0–36.0)
MCV: 85.2 fL (ref 80.0–100.0)
Platelets: 384 10*3/uL (ref 150–400)
RBC: 2.97 MIL/uL — ABNORMAL LOW (ref 3.87–5.11)
RDW: 17.6 % — ABNORMAL HIGH (ref 11.5–15.5)
WBC: 9.4 10*3/uL (ref 4.0–10.5)
nRBC: 0 % (ref 0.0–0.2)

## 2018-04-28 LAB — MAGNESIUM: Magnesium: 1.6 mg/dL — ABNORMAL LOW (ref 1.7–2.4)

## 2018-04-28 LAB — SURGICAL PCR SCREEN
MRSA, PCR: NEGATIVE
Staphylococcus aureus: NEGATIVE

## 2018-04-28 MED ORDER — AZITHROMYCIN 250 MG PO TABS
500.0000 mg | ORAL_TABLET | Freq: Every day | ORAL | Status: DC
  Administered 2018-04-28 – 2018-04-29 (×2): 500 mg via ORAL
  Filled 2018-04-28 (×2): qty 2

## 2018-04-28 MED ORDER — POTASSIUM CHLORIDE 10 MEQ/100ML IV SOLN
10.0000 meq | INTRAVENOUS | Status: AC
  Administered 2018-04-28 (×4): 10 meq via INTRAVENOUS
  Filled 2018-04-28: qty 100

## 2018-04-28 MED ORDER — MAGNESIUM SULFATE 2 GM/50ML IV SOLN
2.0000 g | Freq: Once | INTRAVENOUS | Status: AC
  Administered 2018-04-28: 12:00:00 2 g via INTRAVENOUS
  Filled 2018-04-28: qty 50

## 2018-04-28 MED ORDER — CHLORHEXIDINE GLUCONATE CLOTH 2 % EX PADS
6.0000 | MEDICATED_PAD | Freq: Once | CUTANEOUS | Status: AC
  Administered 2018-04-29: 6 via TOPICAL

## 2018-04-28 MED ORDER — POTASSIUM CHLORIDE CRYS ER 20 MEQ PO TBCR
40.0000 meq | EXTENDED_RELEASE_TABLET | Freq: Once | ORAL | Status: AC
  Administered 2018-04-28: 40 meq via ORAL
  Filled 2018-04-28: qty 2

## 2018-04-28 MED ORDER — CHLORHEXIDINE GLUCONATE CLOTH 2 % EX PADS
6.0000 | MEDICATED_PAD | Freq: Once | CUTANEOUS | Status: AC
  Administered 2018-04-29: 07:00:00 6 via TOPICAL

## 2018-04-28 MED ORDER — FENTANYL CITRATE (PF) 100 MCG/2ML IJ SOLN: 50.0000 ug | Freq: Once | INTRAMUSCULAR | Status: DC

## 2018-04-28 MED ORDER — GUAIFENESIN-DM 100-10 MG/5ML PO SYRP: 5.0000 mL | ORAL_SOLUTION | ORAL | Status: DC | PRN

## 2018-04-28 MED ORDER — SODIUM CHLORIDE 0.9 % IV BOLUS
500.0000 mL | Freq: Once | INTRAVENOUS | Status: AC
  Administered 2018-04-28: 500 mL via INTRAVENOUS

## 2018-04-28 NOTE — Progress Notes (Addendum)
PROGRESS NOTE    Jennifer Stevens  LKG:[id number] DOB: [birth year] DOA: 04/25/2018 PCP: Christain Sacramento, MD   Brief Narrative:  Per HPI: 63 year old female who presented with dyspnea. She does have significant past medical history for COPD, tobacco abuse, PTSD, anxiety and chronic back pain. She reported 4 weeks of persistent dyspnea with productive cough andassociated with precordial chest pain. She was treated as an outpatient with levofloxacin for 7 days with no improvement in her symptoms. On her initial physical examination she was afebrile, heart rate 78, respiratory rate24, blood pressure 123/82, oxygen saturation was 86% on room air,her lungs had decreased breath sounds at left base, positive ralesbut no wheezing, heart S1-S2 present and rhythmic, abdomen was soft nontender, no lower extremity edema. Her chest x-ray showed a left lower lobe opacity, CT chest showed a 3.4 x 3.0 cm left hilar mass with postobstructive atelectasis/pneumonia within the lingula and left lower lobe. 1.4 cm collection of air within the left lower lobe consolidation, concerning for small lung abscess. Trace pleural effusion on the left. EKG had 90 bpm, normal axis, normal intervals, no significant ST segment or T wave changes  Patient was admitted to the hospital working diagnosis of acute hypoxic respiratory failure due to left lower lobe/lingular pneumonia.  She continues to have persistent symptoms this morning along with some diarrhea overnight that is improving. Appreciate Pulmonology input with bronchoscopy scheduled for am.  Assessment & Plan:   Principal Problem:   Pneumonia Active Problems:   PTSD (post-traumatic stress disorder)   Anxiety   Tobacco use disorder   Elevated troponin   Hypokalemia   Severe protein-calorie malnutrition (HCC)   Pressure injury of skin   1. Left lower lobe/ lingula pneumonia/ present on admission. Chest CT suggestive for post obstructive pneumonia with  lung cancer.  Consult to pulmonology with bronch to be done 4/24.  Will continue antibiotic therapy with ceftriaxone and azithromycin (now to PO). Oxymetry monitoring and supplemental 02 per [place]-currently room air.  Appreciate any further pulmonology recommendations.  Added Mucinex, Robitussin, and Pulmicort 4/22.  2. Tobacco abuse. Smoking cessation counseling provided.  3. Hypokalemia-worsening/hypomagnesemia.  Follow-up labs in a.m. Replete. Likely secondary to diarrhea that has now improved with Imodium.  4. Severe protein calorie malnutrition. Continue nutritional supplements.   5. Skin pressure ulcer present on admission. Sacrum stage 1.  6. Anxiety. Continue alprazolam, fluoxetine, trazodone and quetiapine.   7. COPD. No signs of exacerbation, continue Breo and albuterol.  8. Diarrhea-improved.  Imodium continue as needed.  I do not suspect any C. difficile or other issues at this point in time. Maintain on IV fluid as well as current diet.  DVT prophylaxis:enoxaparin Code Status:full Family Communication:no family at the bedside Disposition Plan/ discharge barriers:Bronch per pulm in am.  Body mass index is 15.2 kg/m. Malnutrition Type:  Nutrition Problem: Increased nutrient needs Etiology: chronic illness(acute on chronic respiratory disease)   Malnutrition Characteristics:  Signs/Symptoms: estimated needs, energy intake < or equal to 75% for > or equal to 1 month   Nutrition Interventions:  Interventions: Ensure Enlive (each supplement provides 350kcal and 20 grams of protein), Prostat, MVI  RN Pressure Injury Documentation: Pressure Injury 04/25/18 Stage I - Intact skin with non-blanchable redness of a localized area usually over a bony prominence. (Active)  04/25/18 2200  Location: Sacrum  Location Orientation: Mid  Staging: Stage I - Intact skin with non-blanchable redness of a localized area usually over a bony prominence.  Wound  Description (Comments):  Present on Admission:     Consultants:  Pulmonology consulted 4/22  Procedures:  None  Antimicrobials:  Ceftriaxone and azithromycin  Subjective: Patient seen and evaluated today with no new acute complaints or concerns. No acute concerns or events noted overnight. Diarrhea and cough improved this am.  Objective: Vitals:   04/27/18 2305 04/28/18 0626 04/28/18 0712 04/28/18 0716  BP: (!) 89/55 98/66    Pulse:  78    Resp:  18    Temp:      TempSrc:      SpO2:  97% 94% 94%  Weight:      Height:        Intake/Output Summary (Last 24 hours) at 04/28/2018 1127 Last data filed at 04/28/2018 0900 Gross per 24 hour  Intake 2362.56 ml  Output 1350 ml  Net 1012.56 ml   Filed Weights   04/25/18 1726  Weight: 45.4 kg    Examination:  General exam: Appears calm and comfortable  Respiratory system: Clear to auscultation. Respiratory effort normal. On [place]. Cardiovascular system: S1 & S2 heard, RRR. No JVD, murmurs, rubs, gallops or clicks. No pedal edema. Gastrointestinal system: Abdomen is nondistended, soft and nontender. No organomegaly or masses felt. Normal bowel sounds heard. Central nervous system: Alert and oriented. No focal neurological deficits. Extremities: Symmetric 5 x 5 power. Skin: No rashes, lesions or ulcers Psychiatry: Judgement and insight appear normal. Mood & affect appropriate.     Data Reviewed: I have personally reviewed following labs and imaging studies  CBC: Recent Labs  Lab 04/25/18 1836 04/26/18 0442 04/27/18 0509 04/28/18 0516  WBC 12.0* 6.8 7.6 9.4  NEUTROABS 9.7*  --  6.3  --   HGB 9.9* 9.0* 8.0* 7.9*  HCT 31.1* 29.4*  25.9* 27.3* 25.3*  MCV 82.5 83.5 88.9 85.2  PLT 453* 474* 385 195   Basic Metabolic Panel: Recent Labs  Lab 04/25/18 1836 04/26/18 0442 04/27/18 0509 04/28/18 0516  NA 131* 132* 133* 132*  K 2.4* 3.8 3.5 2.8*  CL 93* 99 104 103  CO2 26 24 24 22   GLUCOSE 105* 122*  86 95  BUN 7* 8 9 7*  CREATININE 0.47 0.48 0.47 0.46  CALCIUM 7.8* 7.7* 7.5* 7.0*  MG 1.9  --   --  1.6*   GFR: Estimated Creatinine Clearance: 52.9 mL/min (by C-G formula based on SCr of 0.46 mg/dL). Liver Function Tests: Recent Labs  Lab 04/25/18 1836 04/26/18 0442  AST 15 13*  ALT 11 9  ALKPHOS 99 87  BILITOT 0.3 0.2*  PROT 6.4* 5.9*  ALBUMIN 2.4* 2.3*   No results for input(s): LIPASE, AMYLASE in the last 168 hours. No results for input(s): AMMONIA in the last 168 hours. Coagulation Profile: No results for input(s): INR, PROTIME in the last 168 hours. Cardiac Enzymes: Recent Labs  Lab 04/25/18 1836 04/25/18 2227 04/26/18 0442 04/26/18 1012  TROPONINI 0.06* 0.04* 0.03* 0.03*   BNP (last 3 results) No results for input(s): PROBNP in the last 8760 hours. HbA1C: No results for input(s): HGBA1C in the last 72 hours. CBG: No results for input(s): GLUCAP in the last 168 hours. Lipid Profile: No results for input(s): CHOL, HDL, LDLCALC, TRIG, CHOLHDL, LDLDIRECT in the last 72 hours. Thyroid Function Tests: No results for input(s): TSH, T4TOTAL, FREET4, T3FREE, THYROIDAB in the last 72 hours. Anemia Panel: Recent Labs    04/25/18 2227  VITAMINB12 510  FERRITIN 258  TIBC 227*  IRON 15*   Sepsis Labs: No results for input(s):  PROCALCITON, LATICACIDVEN in the last 168 hours.  Recent Results (from the past 240 hour(s))  SARS Coronavirus 2 [facility] order, Performed in [place] hospital lab)     Status: None   Collection Time: 04/25/18  5:58 PM  Result Value Ref Range Status   SARS Coronavirus 2 NEGATIVE NEGATIVE Final    Comment: (NOTE) If result is NEGATIVE SARS-CoV-2 target nucleic acids are NOT DETECTED. The SARS-CoV-2 RNA is generally detectable in upper and lower  respiratory specimens during the acute phase of infection. The lowest  concentration of SARS-CoV-2 viral copies this assay can detect is 250  copies / mL. A negative result does not preclude  SARS-CoV-2 infection  and should not be used as the sole basis for treatment or other  patient management decisions.  A negative result may occur with  improper specimen collection / handling, submission of specimen other  than nasopharyngeal swab, presence of viral mutation(s) within the  areas targeted by this assay, and inadequate number of viral copies  (<250 copies / mL). A negative result must be combined with clinical  observations, patient history, and epidemiological information. If result is POSITIVE SARS-CoV-2 target nucleic acids are DETECTED. The SARS-CoV-2 RNA is generally detectable in upper and lower  respiratory specimens dur ing the acute phase of infection.  Positive  results are indicative of active infection with SARS-CoV-2.  Clinical  correlation with patient history and other diagnostic information is  necessary to determine patient infection status.  Positive results do  not rule out bacterial infection or co-infection with other viruses. If result is PRESUMPTIVE POSTIVE SARS-CoV-2 nucleic acids MAY BE PRESENT.   A presumptive positive result was obtained on the submitted specimen  and confirmed on repeat testing.  While 2019 novel coronavirus  (SARS-CoV-2) nucleic acids may be present in the submitted sample  additional confirmatory testing may be necessary for epidemiological  and / or clinical management purposes  to differentiate between  SARS-CoV-2 and other Sarbecovirus currently known to infect humans.  If clinically indicated additional testing with an alternate test  methodology [phone]) is advised. The SARS-CoV-2 RNA is generally  detectable in upper and lower respiratory sp ecimens during the acute  phase of infection. The expected result is Negative. Fact Sheet for Patients:  StrictlyIdeas.no Fact Sheet for Healthcare Providers: BankingDealers.co.za This test is not yet approved or cleared by the  [place] FDA and has been authorized for detection and/or diagnosis of SARS-CoV-2 by FDA under an Emergency Use Authorization (EUA).  This EUA will remain in effect (meaning this test can be used) for the duration of the COVID-19 declaration under Section 564(b)(1) of the Act, 21 U.S.C. section 360bbb-3(b)(1), unless the authorization is terminated or revoked sooner. Performed at [facility], [address], [place], [place] [address]          Radiology Studies: No results found.      Scheduled Meds: . azithromycin  500 mg Oral q1800  . budesonide (PULMICORT) nebulizer solution  0.25 mg Nebulization BID  . enoxaparin (LOVENOX) injection  40 mg Subcutaneous Q24H  . feeding supplement (ENSURE ENLIVE)  237 mL Oral BID BM  . feeding supplement (PRO-STAT SUGAR FREE 64)  30 mL Oral BID  . fentaNYL (SUBLIMAZE) injection  50 mcg Intravenous Once  . FLUoxetine  20 mg Oral q morning - 10a  . fluticasone furoate-vilanterol  1 puff Inhalation Daily  . gabapentin  300 mg Oral TID  . guaiFENesin  600 mg Oral BID  .  mouth rinse  15 mL Mouth Rinse BID  . multivitamin with minerals  1 tablet Oral Daily  . nicotine  21 mg Transdermal Daily  . QUEtiapine  300 mg Oral BID  . traZODone  300 mg Oral QHS   Continuous Infusions: . cefTRIAXone (ROCEPHIN)  IV 1 g (04/27/18 1943)  . dextrose 5 % and 0.9% NaCl 50 mL/hr at 04/26/18 1704  . magnesium sulfate 1 - 4 g bolus IVPB    . potassium chloride 10 mEq (04/28/18 1042)     LOS: 3 days    Time spent: 30 minutes    Jabre Heo Darleen Crocker, DO [facility] Pager [phone]  If 7PM-7AM, please contact night-coverage [email or web address] Password [facility] 04/28/2018, 11:27 AM

## 2018-04-28 NOTE — Plan of Care (Signed)

## 2018-04-28 NOTE — Consult Note (Signed)
Consult requested by: [facility] hospitalist, Dr. Manuella Ghazi Consult requested for: Abnormal chest CT  HPI: This is a 63 year old who came to the emergency department because of increasing shortness of breath.  She said she been having productive cough for several weeks and increasing shortness of breath for several weeks.  She has had pain on the left side of her chest some of which may be related to a fall and she does demonstrate rib fractures on imaging studies.  She says she is lost about 15 pounds.  Normally at home she can ambulate and do some housework without getting too short of breath but she has been much worse.  She had chest x-ray that showed what appeared to be pneumonia and then CT that shows what seems to be a lung mass likely primary lung cancer.  I have personally reviewed both studies.  She is not coughed up any blood.  She is not had any fever or chills.  She is not had any nausea vomiting diarrhea GI bleeding.  No headaches.  She has had some nasal congestion.  She does not use oxygen at home.  She has positive family history of lung cancer in her sister.  Her sister was diagnosed several years ago and is apparently doing fairly well now.  Past Medical History:  Diagnosis Date  . Anxiety   . COPD (chronic obstructive pulmonary disease) ([place]) 12/27/2016  . Hypokalemia 04/25/2018  . PTSD (post-traumatic stress disorder)   . Seizure in infant [facility])    suspect febrile seizure. Pt unsure of history.      Family History  Problem Relation Age of Onset  . Lung cancer Sister      Social History   Socioeconomic History  . Marital status: Single    Spouse name: Not on file  . Number of children: Not on file  . Years of education: Not on file  . Highest education level: Not on file  Occupational History  . Not on file  Social Needs  . Financial resource strain: Not on file  . Food insecurity:    Worry: Not on file    Inability: Not on file  . Transportation needs:    Medical: Not  on file    Non-medical: Not on file  Tobacco Use  . Smoking status: Current Every Day Smoker    Packs/day: 1.00    Types: Cigarettes  . Smokeless tobacco: Never Used  Substance and Sexual Activity  . Alcohol use: No  . Drug use: No  . Sexual activity: Not on file  Lifestyle  . Physical activity:    Days per week: Not on file    Minutes per session: Not on file  . Stress: Not on file  Relationships  . Social connections:    Talks on phone: Not on file    Gets together: Not on file    Attends religious service: Not on file    Active member of club or organization: Not on file    Attends meetings of clubs or organizations: Not on file    Relationship status: Not on file  Other Topics Concern  . Not on file  Social History Narrative  . Not on file     ROS: Except as mentioned 10 point review of systems is negative    Objective: Vital signs in last 24 hours: Temp:  [98.5 F (36.9 C)-98.9 F (37.2 C)] 98.9 F (37.2 C) (04/22 2147) Pulse Rate:  [75-124] 78 (04/23 0626) Resp:  [18-20]  18 (04/23 0626) BP: (78-99)/(52-74) 98/66 (04/23 0626) SpO2:  [87 %-98 %] 94 % (04/23 0716) Weight change:  Last BM Date: 04/27/18  Intake/Output from previous day: 04/22 0701 - 04/23 0700 In: 2242.6 [P.O.:480; I.V.:912.6; IV Piggyback:850] Out: 700 [Urine:700]  PHYSICAL EXAM Constitutional: She is awake and alert.  She is thin.  Eyes: Pupils react EOMI.  Ears nose mouth and throat: Mucous membranes are moist.  Hearing is grossly normal.  Throat is clear.  Cardiovascular: Her heart is regular with normal heart sounds.  Respiratory: Respiratory effort is somewhat increased.  She has fairly markedly diminished breath sounds bilaterally worse on the left.  She has some crackles in the left base.  Gastrointestinal: Her abdomen soft with no masses.  Skin: Warm and dry.  Musculoskeletal: Normal strength upper and lower extremities bilaterally.  Neurological: No focal abnormalities.  Psychiatric:  She seems anxious  Lab Results: Basic Metabolic Panel: Recent Labs    04/25/18 1836  04/27/18 0509 04/28/18 0516  NA 131*   < > 133* 132*  K 2.4*   < > 3.5 2.8*  CL 93*   < > 104 103  CO2 26   < > 24 22  GLUCOSE 105*   < > 86 95  BUN 7*   < > 9 7*  CREATININE 0.47   < > 0.47 0.46  CALCIUM 7.8*   < > 7.5* 7.0*  MG 1.9  --   --  1.6*   < > = values in this interval not displayed.   Liver Function Tests: Recent Labs    04/25/18 1836 04/26/18 0442  AST 15 13*  ALT 11 9  ALKPHOS 99 87  BILITOT 0.3 0.2*  PROT 6.4* 5.9*  ALBUMIN 2.4* 2.3*   No results for input(s): LIPASE, AMYLASE in the last 72 hours. No results for input(s): AMMONIA in the last 72 hours. CBC: Recent Labs    04/25/18 1836  04/27/18 0509 04/28/18 0516  WBC 12.0*   < > 7.6 9.4  NEUTROABS 9.7*  --  6.3  --   HGB 9.9*   < > 8.0* 7.9*  HCT 31.1*   < > 27.3* 25.3*  MCV 82.5   < > 88.9 85.2  PLT 453*   < > 385 384   < > = values in this interval not displayed.   Cardiac Enzymes: Recent Labs    04/25/18 2227 04/26/18 0442 04/26/18 1012  TROPONINI 0.04* 0.03* 0.03*   BNP: No results for input(s): PROBNP in the last 72 hours. D-Dimer: No results for input(s): DDIMER in the last 72 hours. CBG: No results for input(s): GLUCAP in the last 72 hours. Hemoglobin A1C: No results for input(s): HGBA1C in the last 72 hours. Fasting Lipid Panel: No results for input(s): CHOL, HDL, LDLCALC, TRIG, CHOLHDL, LDLDIRECT in the last 72 hours. Thyroid Function Tests: No results for input(s): TSH, T4TOTAL, FREET4, T3FREE, THYROIDAB in the last 72 hours. Anemia Panel: Recent Labs    04/25/18 2227  VITAMINB12 510  FERRITIN 258  TIBC 227*  IRON 15*   Coagulation: No results for input(s): LABPROT, INR in the last 72 hours. Urine Drug Screen: Drugs of Abuse  No results found for: LABOPIA, COCAINSCRNUR, LABBENZ, AMPHETMU, THCU, LABBARB  Alcohol Level: No results for input(s): ETH in the last 72  hours. Urinalysis: Recent Labs    04/25/18 1748  COLORURINE YELLOW  LABSPEC 1.009  PHURINE 8.0  GLUCOSEU NEGATIVE  HGBUR NEGATIVE  BILIRUBINUR NEGATIVE  KETONESUR NEGATIVE  PROTEINUR NEGATIVE  NITRITE NEGATIVE  LEUKOCYTESUR NEGATIVE   Misc. Labs:   ABGS: No results for input(s): PHART, PO2ART, TCO2, HCO3 in the last 72 hours.  Invalid input(s): PCO2   MICROBIOLOGY: Recent Results (from the past 240 hour(s))  SARS Coronavirus 2 [facility] order, Performed in [facility] hospital lab)     Status: None   Collection Time: 04/25/18  5:58 PM  Result Value Ref Range Status   SARS Coronavirus 2 NEGATIVE NEGATIVE Final    Comment: (NOTE) If result is NEGATIVE SARS-CoV-2 target nucleic acids are NOT DETECTED. The SARS-CoV-2 RNA is generally detectable in upper and lower  respiratory specimens during the acute phase of infection. The lowest  concentration of SARS-CoV-2 viral copies this assay can detect is 250  copies / mL. A negative result does not preclude SARS-CoV-2 infection  and should not be used as the sole basis for treatment or other  patient management decisions.  A negative result may occur with  improper specimen collection / handling, submission of specimen other  than nasopharyngeal swab, presence of viral mutation(s) within the  areas targeted by this assay, and inadequate number of viral copies  (<250 copies / mL). A negative result must be combined with clinical  observations, patient history, and epidemiological information. If result is POSITIVE SARS-CoV-2 target nucleic acids are DETECTED. The SARS-CoV-2 RNA is generally detectable in upper and lower  respiratory specimens dur ing the acute phase of infection.  Positive  results are indicative of active infection with SARS-CoV-2.  Clinical  correlation with patient history and other diagnostic information is  necessary to determine patient infection status.  Positive results do  not rule out bacterial  infection or co-infection with other viruses. If result is PRESUMPTIVE POSTIVE SARS-CoV-2 nucleic acids MAY BE PRESENT.   A presumptive positive result was obtained on the submitted specimen  and confirmed on repeat testing.  While 2019 novel coronavirus  (SARS-CoV-2) nucleic acids may be present in the submitted sample  additional confirmatory testing may be necessary for epidemiological  and / or clinical management purposes  to differentiate between  SARS-CoV-2 and other Sarbecovirus currently known to infect humans.  If clinically indicated additional testing with an alternate test  methodology [phone]) is advised. The SARS-CoV-2 RNA is generally  detectable in upper and lower respiratory sp ecimens during the acute  phase of infection. The expected result is Negative. Fact Sheet for Patients:  StrictlyIdeas.no Fact Sheet for Healthcare Providers: BankingDealers.co.za This test is not yet approved or cleared by the [place] FDA and has been authorized for detection and/or diagnosis of SARS-CoV-2 by FDA under an Emergency Use Authorization (EUA).  This EUA will remain in effect (meaning this test can be used) for the duration of the COVID-19 declaration under Section 564(b)(1) of the Act, 21 U.S.C. section 360bbb-3(b)(1), unless the authorization is terminated or revoked sooner. Performed at [facility], [address] [place], [place] [address]     Studies/Results: No results found.  Medications:  Prior to Admission:  Medications Prior to Admission  Medication Sig Dispense Refill Last Dose  . albuterol (PROVENTIL HFA;VENTOLIN HFA) 108 (90 Base) MCG/ACT inhaler Inhale 1-2 puffs into the lungs every 6 (six) hours as needed for wheezing or shortness of breath.   04/25/2018 at Unknown time  . ALPRAZolam (XANAX) 1 MG tablet Take 0.5-1 mg by mouth 4 (four) times daily.    04/25/2018 at Unknown time  . FLUoxetine (PROZAC) 20 MG  capsule Take 20 mg by  mouth every morning.   04/25/2018 at Unknown time  . fluticasone-salmeterol (ADVAIR HFA) 115-21 MCG/ACT inhaler Inhale 2 puffs into the lungs 2 (two) times a day.   04/25/2018 at Unknown time  . gabapentin (NEURONTIN) 300 MG capsule Take 300 mg by mouth 3 (three) times daily.   04/25/2018 at Unknown time  . HYDROcodone-acetaminophen (NORCO) 10-325 MG tablet Take 0.5-1 tablets by mouth 5 (five) times daily.    04/25/2018 at Unknown time  . ondansetron (ZOFRAN-ODT) 4 MG disintegrating tablet Take 4 mg by mouth every 8 (eight) hours as needed for nausea or vomiting.    unknown  . QUEtiapine (SEROQUEL) 300 MG tablet Take 300 mg by mouth 2 (two) times daily.   04/25/2018 at Unknown time  . tiZANidine (ZANAFLEX) 4 MG tablet Take 4 mg by mouth every 6 (six) hours as needed for muscle spasms.   unknown  . traZODone (DESYREL) 150 MG tablet Take 300 mg by mouth at bedtime.   04/24/2018 at Unknown time  . [EXPIRED] dexamethasone (DECADRON) 4 MG tablet Take 4 mg by mouth 2 (two) times a day. 10 day course starting on 04/15/2018   Completed Course at Unknown time  . [EXPIRED] HYDROcodone-homatropine (HYCODAN) 5-1.5 MG/5ML syrup Take 5 mLs by mouth every 6 (six) hours as needed for cough.    Completed Course at Unknown time  . levofloxacin (LEVAQUIN) 500 MG tablet Take 500 mg by mouth daily. 10 day course starting on 04/15/2018   Completed Course at Unknown time   Scheduled: . budesonide (PULMICORT) nebulizer solution  0.25 mg Nebulization BID  . enoxaparin (LOVENOX) injection  40 mg Subcutaneous Q24H  . feeding supplement (ENSURE ENLIVE)  237 mL Oral BID BM  . feeding supplement (PRO-STAT SUGAR FREE 64)  30 mL Oral BID  . fentaNYL (SUBLIMAZE) injection  50 mcg Intravenous Once  . FLUoxetine  20 mg Oral q morning - 10a  . fluticasone furoate-vilanterol  1 puff Inhalation Daily  . gabapentin  300 mg Oral TID  . guaiFENesin  600 mg Oral BID  . mouth rinse  15 mL Mouth Rinse BID  . multivitamin  with minerals  1 tablet Oral Daily  . nicotine  21 mg Transdermal Daily  . potassium chloride  40 mEq Oral Once  . QUEtiapine  300 mg Oral BID  . traZODone  300 mg Oral QHS   Continuous: . azithromycin 500 mg (04/27/18 2000)  . cefTRIAXone (ROCEPHIN)  IV 1 g (04/27/18 1943)  . dextrose 5 % and 0.9% NaCl 50 mL/hr at 04/26/18 1704  . magnesium sulfate 1 - 4 g bolus IVPB    . potassium chloride     RWE:[id number], ALPRAZolam, benzonatate, guaiFENesin-dextromethorphan, HYDROcodone-acetaminophen, loperamide, ondansetron, tiZANidine  Assesment: She was admitted with pneumonia.  This appears to be related to lung cancer.  She has a lesion that could probably be approached with bronchoscopy.  She agrees to bronchoscopy.  I have checked with the operating room and I could do that in the morning.  She has COPD at baseline which sounds at least moderately severe  She has severe protein calorie malnutrition and has lost about 15 pounds in the last 6 weeks or so.  She has history of posttraumatic stress disorder on medication  She has multiple rib fractures from a fall and she does complain of some left-sided chest pain  She has elevated troponin thought to be related to demand ischemia but cardiology consultation has been requested  She has hypokalemia which was better  but she is hypokalemic again today  She has anemia likely related to her cancer Principal Problem:   Pneumonia Active Problems:   PTSD (post-traumatic stress disorder)   Anxiety   Tobacco use disorder   Elevated troponin   Hypokalemia   Severe protein-calorie malnutrition (HCC)   Pressure injury of skin    Plan: For bronchoscopy in the morning    LOS: 3 days   Alonza Bogus 04/28/2018, 8:25 AM

## 2018-04-28 NOTE — Progress Notes (Signed)
PT Cancellation Note  Patient Details Name: Jennifer Stevens MRN: [id number] DOB: [birth year]   Cancelled Treatment:    Reason Eval/Treat Not Completed: Other (comment) Attempted PT session.  Pt was informed earlier this morning about a mass in lungs and feels depressed upon news, did not wish to participate today.  Pt left in bed with call bell within reach.  Pain scale 10/10 Lt chest.  Ihor Austin, LPTA; CBIS [phone]  Aldona Lento 04/28/2018, 10:41 AM

## 2018-04-29 ENCOUNTER — Encounter (HOSPITAL_COMMUNITY)
Admission: EM | Disposition: A | Discharge status: Home / Self Care | Payer: Self-pay | Source: Home / Self Care | Attending: Internal Medicine

## 2018-04-29 ENCOUNTER — Inpatient Hospital Stay (HOSPITAL_COMMUNITY): Payer: Medicare Other | Admitting: Anesthesiology

## 2018-04-29 ENCOUNTER — Encounter (HOSPITAL_COMMUNITY): Payer: Self-pay | Admitting: *Deleted

## 2018-04-29 HISTORY — PX: FLEXIBLE BRONCHOSCOPY: SHX5094

## 2018-04-29 HISTORY — PX: BILIARY BRUSHING: SHX6843

## 2018-04-29 HISTORY — PX: BIOPSY: SHX5522

## 2018-04-29 HISTORY — PX: BRONCHIAL WASHINGS: SHX5105

## 2018-04-29 LAB — BASIC METABOLIC PANEL
Anion gap: 6 (ref 5–15)
BUN: 7 mg/dL — ABNORMAL LOW (ref 8–23)
CO2: 22 mmol/L (ref 22–32)
Calcium: 7.1 mg/dL — ABNORMAL LOW (ref 8.9–10.3)
Chloride: 105 mmol/L (ref 98–111)
Creatinine, Ser: 0.42 mg/dL — ABNORMAL LOW (ref 0.44–1.00)
GFR calc Af Amer: >60 mL/min (ref >60)
GFR calc non Af Amer: >60 mL/min (ref >60)
Glucose, Bld: 120 mg/dL — ABNORMAL HIGH (ref 70–99)
Potassium: 3.8 mmol/L (ref 3.5–5.1)
Sodium: 133 mmol/L — ABNORMAL LOW (ref 135–145)

## 2018-04-29 LAB — CBC
HCT: 26.5 % — ABNORMAL LOW (ref 36.0–46.0)
Hemoglobin: 8.2 g/dL — ABNORMAL LOW (ref 12.0–15.0)
MCH: 26.9 pg (ref 26.0–34.0)
MCHC: 30.9 g/dL (ref 30.0–36.0)
MCV: 86.9 fL (ref 80.0–100.0)
Platelets: 405 10*3/uL — ABNORMAL HIGH (ref 150–400)
RBC: 3.05 MIL/uL — ABNORMAL LOW (ref 3.87–5.11)
RDW: 17.9 % — ABNORMAL HIGH (ref 11.5–15.5)
WBC: 7.8 10*3/uL (ref 4.0–10.5)
nRBC: 0 % (ref 0.0–0.2)

## 2018-04-29 LAB — MAGNESIUM: Magnesium: 1.8 mg/dL (ref 1.7–2.4)

## 2018-04-29 SURGERY — BRONCHOSCOPY, FLEXIBLE
Anesthesia: Monitor Anesthesia Care

## 2018-04-29 MED ORDER — POTASSIUM CHLORIDE IN NACL 20-0.9 MEQ/L-% IV SOLN
INTRAVENOUS | Status: AC
  Administered 2018-04-29: 03:00:00 via INTRAVENOUS

## 2018-04-29 MED ORDER — LIDOCAINE VISCOUS HCL 2 % MT SOLN
OROMUCOSAL | Status: AC
  Filled 2018-04-29: qty 15

## 2018-04-29 MED ORDER — PROPOFOL 10 MG/ML IV BOLUS
INTRAVENOUS | Status: AC
  Filled 2018-04-29: qty 20

## 2018-04-29 MED ORDER — PROPOFOL 500 MG/50ML IV EMUL
INTRAVENOUS | Status: DC | PRN
  Administered 2018-04-29: 75 ug/kg/min via INTRAVENOUS

## 2018-04-29 MED ORDER — LIDOCAINE HCL (PF) 2 % IJ SOLN
INTRAMUSCULAR | Status: AC
  Filled 2018-04-29: qty 10

## 2018-04-29 MED ORDER — PROPOFOL 10 MG/ML IV BOLUS
INTRAVENOUS | Status: DC | PRN
  Administered 2018-04-29: 10 mg via INTRAVENOUS

## 2018-04-29 MED ORDER — BUTAMBEN-TETRACAINE-BENZOCAINE 2-2-14 % EX AERO
INHALATION_SPRAY | CUTANEOUS | Status: AC
  Filled 2018-04-29: qty 5

## 2018-04-29 MED ORDER — LACTATED RINGERS IV SOLN
INTRAVENOUS | Status: DC
  Administered 2018-04-29: 1000 mL via INTRAVENOUS

## 2018-04-29 SURGICAL SUPPLY — 16 items
BRUSH CYTOL CELLEBRITY 1.5X140 (MISCELLANEOUS) ×4 IMPLANT
CLOTH BEACON ORANGE TIMEOUT ST (SAFETY) ×4 IMPLANT
CONNECTOR 5 IN 1 STRAIGHT STRL (MISCELLANEOUS) ×4 IMPLANT
FORCEPS BIOP RJ4 1.8 (CUTTING FORCEPS) ×4 IMPLANT
GLOVE BIO SURGEON STRL SZ7.5 (GLOVE) ×4 IMPLANT
KIT CLEAN CATCH URINE (SET/KITS/TRAYS/PACK) IMPLANT
MARKER SKIN DUAL TIP RULER LAB (MISCELLANEOUS) ×4 IMPLANT
NS IRRIG 1000ML POUR BTL (IV SOLUTION) ×4 IMPLANT
SPONGE GAUZE 4X4 12PLY (GAUZE/BANDAGES/DRESSINGS) ×4 IMPLANT
SYR 20CC LL (SYRINGE) ×4 IMPLANT
SYR 30ML LL (SYRINGE) ×4 IMPLANT
SYR CONTROL 10ML LL (SYRINGE) ×4 IMPLANT
TRAP SPECIMEN CP (MISCELLANEOUS) ×4 IMPLANT
VALVE DISPOSABLE (MISCELLANEOUS) ×4 IMPLANT
WATER STERILE IRR 1000ML POUR (IV SOLUTION) ×4 IMPLANT
YANKAUER SUCT BULB TIP 10FT TU (MISCELLANEOUS) ×8 IMPLANT

## 2018-04-29 NOTE — Anesthesia Postprocedure Evaluation (Signed)
Anesthesia Post Note  Patient: Jennifer Stevens  Procedure(s) Performed: FLEXIBLE BRONCHOSCOPY (N/A ) BILIARY BRUSHING (Left ) BIOPSY (Left ) BRONCHIAL WASHINGS (Left )  Patient location during evaluation: PACU Anesthesia Type: MAC Level of consciousness: awake and patient cooperative Pain management: pain level controlled Vital Signs Assessment: post-procedure vital signs reviewed and stable Respiratory status: spontaneous breathing, nonlabored ventilation and respiratory function stable Cardiovascular status: blood pressure returned to baseline Postop Assessment: no apparent nausea or vomiting Anesthetic complications: no     Last Vitals:  Vitals:   04/29/18 0654 04/29/18 0800  BP:  97/71  Pulse:    Resp:  15  Temp:  37.2 C  SpO2: 93% 92%    Last Pain:  Vitals:   04/29/18 0800  TempSrc:   PainSc: 0-No pain                 [name]

## 2018-04-29 NOTE — Op Note (Signed)
Bronchoscopy Procedure Note MAGDALINE ZOLLARS [id number] 09/29/1955  Procedure: Bronchoscopy Indications: Diagnostic evaluation of the airways  Procedure Details Consent: Risks of procedure as well as the alternatives and risks of each were explained to the (patient/caregiver).  Consent for procedure obtained. Time Out: Verified patient identification, verified procedure, site/side was marked, verified correct patient position, special equipment/implants available, medications/allergies/relevent history reviewed, required imaging and test results available.  Performed  In preparation for procedure, bronchoscope lubricated. Sedation: Propofol per anesthesia  Airway entered and the following bronchi were examined: RUL, RML, RLL, LUL, LLL and Bronchi.  Right lung bronchi were clear.  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 Procedures performed: Brushings performed biopsies of the endobronchial lesion were made and washings were done Bronchoscope removed.    Evaluation Hemodynamic Status: BP stable throughout; O2 sats: Fell during and immediately after the procedure Patient's Current Condition: stable Specimens:  Sent serosanguinous fluid Complications: Complications of Hypoxia Patient did tolerate procedure well.   Alonza Bogus 04/29/2018

## 2018-04-29 NOTE — Anesthesia Preprocedure Evaluation (Signed)
Anesthesia Evaluation  Patient identified by MRN, date of birth, ID band Patient awake    Reviewed: Allergy & Precautions, NPO status , Patient's Chart, lab work & pertinent test results, reviewed documented beta blocker date and time   Airway Mallampati: III   Neck ROM: full  Mouth opening: Limited Mouth Opening  Dental  (+) Edentulous Upper, Partial Lower   Pulmonary sleep apnea , pneumonia, COPD, Current Smoker,    breath sounds clear to auscultation       Cardiovascular      Neuro/Psych Seizures -,  PSYCHIATRIC DISORDERS Anxiety    GI/Hepatic negative GI ROS, Neg liver ROS,   Endo/Other  negative endocrine ROS  Renal/GU negative Renal ROS  negative genitourinary   Musculoskeletal   Abdominal   Peds  Hematology  (+) Blood dyscrasia, anemia ,   Anesthesia Other Findings   Reproductive/Obstetrics                             Anesthesia Physical Anesthesia Plan  ASA: III  Anesthesia Plan: MAC   Post-op Pain Management:    Induction:   PONV Risk Score and Plan: 2  Airway Management Planned:   Additional Equipment:   Intra-op Plan:   Post-operative Plan:   Informed Consent: I have reviewed the patients History and Physical, chart, labs and discussed the procedure including the risks, benefits and alternatives for the proposed anesthesia with the patient or authorized representative who has indicated his/her understanding and acceptance.     Dental Advisory Given  Plan Discussed with: CRNA  Anesthesia Plan Comments:         Anesthesia Quick Evaluation

## 2018-04-29 NOTE — Progress Notes (Signed)
PT Cancellation Note  Patient Details Name: Jennifer Stevens MRN: [id number] DOB: 09-01-1955   Cancelled Treatment:    Reason Eval/Treat Not Completed: Other (comment)  Attempted PT session, pt declined.  Pt upset and tearful following news of mass in lungs yesterday.  Encouraged pt to complete PT to assist with strength to reduce increased health risks including pneumonia, weakness and bed sores.  Pt stated she would consider it later today.  [address], LPTA; [place]  Aldona Lento 04/29/2018, 12:36 PM

## 2018-04-29 NOTE — Transfer of Care (Signed)
Immediate Anesthesia Transfer of Care Note  Patient: Jennifer Stevens  Procedure(s) Performed: FLEXIBLE BRONCHOSCOPY (N/A ) BILIARY BRUSHING (Left ) BIOPSY (Left ) BRONCHIAL WASHINGS (Left )  Patient Location: PACU  Anesthesia Type:General  Level of Consciousness: awake and patient cooperative  Airway & Oxygen Therapy: Patient Spontanous Breathing and Patient connected to face mask oxygen  Post-op Assessment: Report given to RN, Post -op Vital signs reviewed and stable and Patient moving all extremities  Post vital signs: Reviewed and stable  Last Vitals:  Vitals Value Taken Time  BP    Temp    Pulse 86 04/29/2018  8:02 AM  Resp 12 04/29/2018  8:02 AM  SpO2 86 % 04/29/2018  8:02 AM  Vitals shown include unvalidated device data.  Last Pain:  Vitals:   04/29/18 0651  TempSrc: Oral  PainSc: 4          Complications: No apparent anesthesia complications

## 2018-04-29 NOTE — Progress Notes (Signed)
PROGRESS NOTE    Jennifer Stevens  VOH:[id number] DOB: 09/18/1955 DOA: 04/25/2018 PCP: Christain Sacramento, MD   Brief Narrative:  Per HPI: 63 year old female who presented with dyspnea. She does have significant past medical history for COPD, tobacco abuse, PTSD, anxiety and chronic back pain. She reported 4 weeks of persistent dyspnea with productive cough andassociated with precordial chest pain. She was treated as an outpatient with levofloxacin for 7 days with no improvement in her symptoms. On her initial physical examination she was afebrile, heart rate 78, respiratory rate24, blood pressure 123/82, oxygen saturation was 86% on room air,her lungs had decreased breath sounds at left base, positive ralesbut no wheezing, heart S1-S2 present and rhythmic, abdomen was soft nontender, no lower extremity edema. Her chest x-ray showed a left lower lobe opacity, CT chest showed a 3.4 x 3.0 cm left hilar mass with postobstructive atelectasis/pneumonia within the lingula and left lower lobe. 1.4 cm collection of air within the left lower lobe consolidation, concerning for small lung abscess. Trace pleural effusion on the left. EKG had 90 bpm, normal axis, normal intervals, no significant ST segment or T wave changes  Patient was admitted to the hospital working diagnosis of acute hypoxic respiratory failure due to left lower lobe/lingular pneumonia.  She continues to have persistent symptoms this morning along with some diarrhea overnight that is improving. Appreciate Pulmonology input with bronchoscopy and biopsy performed on 4/24.   Assessment & Plan:   Principal Problem:   Pneumonia Active Problems:   PTSD (post-traumatic stress disorder)   Anxiety   Tobacco use disorder   Elevated troponin   Hypokalemia   Severe protein-calorie malnutrition (HCC)   Pressure injury of skin   1. Left lower lobe/ lingula pneumonia/ present on admission. Chest CT suggestive for post obstructive  pneumonia with lung cancer.Consult to pulmonology with bronch and biopsy done 4/24. Will continue antibiotic therapy with ceftriaxone and azithromycin (now to PO). Oxymetry monitoring and supplemental 02 per [place]-currently room air.Appreciate any further pulmonology recommendations. Continue Mucinex, Robitussin, and Pulmicort.  2. Tobacco abuse. Smoking cessationcounseling provided.  3. Hypokalemia-worsening/hypomagnesemia-resolved.   4. Severe protein calorie malnutrition. Continue nutritional supplements.   5. Skin pressure ulcer present on admission. Sacrum stage 1.  6. Anxiety. Continue alprazolam, fluoxetine, trazodone and quetiapine.   7. COPD. No signs of exacerbation, continue Breo and albuterol.  8. Diarrhea-improved.Imodium continue as needed. I do not suspect any C. difficile or other issues at this point in time. DC IVF today.  DVT prophylaxis:enoxaparin Code Status:full Family Communication:no family at the bedside Disposition Plan/ discharge barriers:Anticipate DC in am if stable.  Body mass index is 15.2 kg/m. Malnutrition Type:  Nutrition Problem: Increased nutrient needs Etiology: chronic illness(acute on chronic respiratory disease)   Malnutrition Characteristics:  Signs/Symptoms: estimated needs, energy intake < or equal to 75% for > or equal to 1 month   Nutrition Interventions:  Interventions: Ensure Enlive (each supplement provides 350kcal and 20 grams of protein), Prostat, MVI  RN Pressure Injury Documentation: Pressure Injury 04/25/18 Stage I - Intact skin with non-blanchable redness of a localized area usually over a bony prominence. (Active)  04/25/18 2200  Location: Sacrum  Location Orientation: Mid  Staging: Stage I - Intact skin with non-blanchable redness of a localized area usually over a bony prominence.  Wound Description (Comments):   Present on Admission:     Consultants:  Pulmonology  consulted 4/22  Procedures:  None  Antimicrobials:  Ceftriaxone and azithromycin  Subjective: Patient seen and evaluated  today with no new acute complaints or concerns. No acute concerns or events noted overnight. She is s/p bronchoscopy this am.  Objective: Vitals:   04/29/18 0815 04/29/18 0830 04/29/18 0845 04/29/18 0900  BP: 101/60 (!) 110/58 (!) 95/55 105/85  Pulse: 86 90 81 82  Resp: (!) 23 (!) 24 20   Temp:    99.2 F (37.3 C)  TempSrc:      SpO2: 91% (!) 85% 92% 92%  Weight:      Height:        Intake/Output Summary (Last 24 hours) at 04/29/2018 1217 Last data filed at 04/29/2018 0737 Gross per 24 hour  Intake 2141.37 ml  Output 1650 ml  Net 491.37 ml   Filed Weights   04/25/18 1726 04/29/18 0651  Weight: 45.4 kg 45.4 kg    Examination:  General exam: Appears calm and comfortable  Respiratory system: Clear to auscultation. Respiratory effort normal. Cardiovascular system: S1 & S2 heard, RRR. No JVD, murmurs, rubs, gallops or clicks. No pedal edema. Gastrointestinal system: Abdomen is nondistended, soft and nontender. No organomegaly or masses felt. Normal bowel sounds heard. Central nervous system: Alert and oriented. No focal neurological deficits. Extremities: Symmetric 5 x 5 power. Skin: No rashes, lesions or ulcers Psychiatry: Judgement and insight appear normal. Mood & affect appropriate.     Data Reviewed: I have personally reviewed following labs and imaging studies  CBC: Recent Labs  Lab 04/25/18 1836 04/26/18 0442 04/27/18 0509 04/28/18 0516 04/29/18 0627  WBC 12.0* 6.8 7.6 9.4 7.8  NEUTROABS 9.7*  --  6.3  --   --   HGB 9.9* 9.0* 8.0* 7.9* 8.2*  HCT 31.1* 29.4*  25.9* 27.3* 25.3* 26.5*  MCV 82.5 83.5 88.9 85.2 86.9  PLT 453* 474* 385 384 989*   Basic Metabolic Panel: Recent Labs  Lab 04/25/18 1836 04/26/18 0442 04/27/18 0509 04/28/18 0516 04/29/18 0627  NA 131* 132* 133* 132* 133*  K 2.4* 3.8 3.5 2.8* 3.8  CL 93* 99  104 103 105  CO2 26 24 24 22 22   GLUCOSE 105* 122* 86 95 120*  BUN 7* 8 9 7* 7*  CREATININE 0.47 0.48 0.47 0.46 0.42*  CALCIUM 7.8* 7.7* 7.5* 7.0* 7.1*  MG 1.9  --   --  1.6* 1.8   GFR: Estimated Creatinine Clearance: 52.9 mL/min (A) (by C-G formula based on SCr of 0.42 mg/dL (L)). Liver Function Tests: Recent Labs  Lab 04/25/18 1836 04/26/18 0442  AST 15 13*  ALT 11 9  ALKPHOS 99 87  BILITOT 0.3 0.2*  PROT 6.4* 5.9*  ALBUMIN 2.4* 2.3*   No results for input(s): LIPASE, AMYLASE in the last 168 hours. No results for input(s): AMMONIA in the last 168 hours. Coagulation Profile: No results for input(s): INR, PROTIME in the last 168 hours. Cardiac Enzymes: Recent Labs  Lab 04/25/18 1836 04/25/18 2227 04/26/18 0442 04/26/18 1012  TROPONINI 0.06* 0.04* 0.03* 0.03*   BNP (last 3 results) No results for input(s): PROBNP in the last 8760 hours. HbA1C: No results for input(s): HGBA1C in the last 72 hours. CBG: No results for input(s): GLUCAP in the last 168 hours. Lipid Profile: No results for input(s): CHOL, HDL, LDLCALC, TRIG, CHOLHDL, LDLDIRECT in the last 72 hours. Thyroid Function Tests: No results for input(s): TSH, T4TOTAL, FREET4, T3FREE, THYROIDAB in the last 72 hours. Anemia Panel: No results for input(s): VITAMINB12, FOLATE, FERRITIN, TIBC, IRON, RETICCTPCT in the last 72 hours. Sepsis Labs: No results for input(s): PROCALCITON, LATICACIDVEN  in the last 168 hours.  Recent Results (from the past 240 hour(s))  SARS Coronavirus 2 [facility] order, Performed in [place] hospital lab)     Status: None   Collection Time: 04/25/18  5:58 PM  Result Value Ref Range Status   SARS Coronavirus 2 NEGATIVE NEGATIVE Final    Comment: (NOTE) If result is NEGATIVE SARS-CoV-2 target nucleic acids are NOT DETECTED. The SARS-CoV-2 RNA is generally detectable in upper and lower  respiratory specimens during the acute phase of infection. The lowest  concentration of  SARS-CoV-2 viral copies this assay can detect is 250  copies / mL. A negative result does not preclude SARS-CoV-2 infection  and should not be used as the sole basis for treatment or other  patient management decisions.  A negative result may occur with  improper specimen collection / handling, submission of specimen other  than nasopharyngeal swab, presence of viral mutation(s) within the  areas targeted by this assay, and inadequate number of viral copies  (<250 copies / mL). A negative result must be combined with clinical  observations, patient history, and epidemiological information. If result is POSITIVE SARS-CoV-2 target nucleic acids are DETECTED. The SARS-CoV-2 RNA is generally detectable in upper and lower  respiratory specimens dur ing the acute phase of infection.  Positive  results are indicative of active infection with SARS-CoV-2.  Clinical  correlation with patient history and other diagnostic information is  necessary to determine patient infection status.  Positive results do  not rule out bacterial infection or co-infection with other viruses. If result is PRESUMPTIVE POSTIVE SARS-CoV-2 nucleic acids MAY BE PRESENT.   A presumptive positive result was obtained on the submitted specimen  and confirmed on repeat testing.  While 2019 novel coronavirus  (SARS-CoV-2) nucleic acids may be present in the submitted sample  additional confirmatory testing may be necessary for epidemiological  and / or clinical management purposes  to differentiate between  SARS-CoV-2 and other Sarbecovirus currently known to infect humans.  If clinically indicated additional testing with an alternate test  methodology [id number]) is advised. The SARS-CoV-2 RNA is generally  detectable in upper and lower respiratory sp ecimens during the acute  phase of infection. The expected result is Negative. Fact Sheet for Patients:  StrictlyIdeas.no Fact Sheet for Healthcare  Providers: BankingDealers.co.za This test is not yet approved or cleared by the [place] FDA and has been authorized for detection and/or diagnosis of SARS-CoV-2 by FDA under an Emergency Use Authorization (EUA).  This EUA will remain in effect (meaning this test can be used) for the duration of the COVID-19 declaration under Section 564(b)(1) of the Act, 21 U.S.C. section 360bbb-3(b)(1), unless the authorization is terminated or revoked sooner. Performed at [facility], [address], [place], [place] [address]   Surgical pcr screen     Status: None   Collection Time: 04/28/18  1:54 PM  Result Value Ref Range Status   MRSA, PCR NEGATIVE NEGATIVE Final   Staphylococcus aureus NEGATIVE NEGATIVE Final    Comment: (NOTE) The Xpert SA Assay (FDA approved for NASAL specimens in patients 77 years of age and older), is one component of a comprehensive surveillance program. It is not intended to diagnose infection nor to guide or monitor treatment. Performed at [facility], [address], [place], [place] [address]          Radiology Studies: No results found.      Scheduled Meds: . azithromycin  500 mg Oral q1800  . budesonide (  PULMICORT) nebulizer solution  0.25 mg Nebulization BID  . butamben-tetracaine-benzocaine      . enoxaparin (LOVENOX) injection  40 mg Subcutaneous Q24H  . feeding supplement (ENSURE ENLIVE)  237 mL Oral BID BM  . feeding supplement (PRO-STAT SUGAR FREE 64)  30 mL Oral BID  . fentaNYL (SUBLIMAZE) injection  50 mcg Intravenous Once  . FLUoxetine  20 mg Oral q morning - 10a  . fluticasone furoate-vilanterol  1 puff Inhalation Daily  . gabapentin  300 mg Oral TID  . guaiFENesin  600 mg Oral BID  . mouth rinse  15 mL Mouth Rinse BID  . multivitamin with minerals  1 tablet Oral Daily  . nicotine  21 mg Transdermal Daily  . QUEtiapine  300 mg Oral BID  . traZODone  300 mg Oral QHS   Continuous Infusions: . cefTRIAXone  (ROCEPHIN)  IV 1 g (04/28/18 1935)     LOS: 4 days    Time spent: 30 minutes    Santia Labate Darleen Crocker, DO [facility] Pager [phone]  If 7PM-7AM, please contact night-coverage [email or web address] Password [facility] 04/29/2018, 12:17 PM

## 2018-04-29 NOTE — H&P (Signed)
This is an update to H&P done on the 20th by Dr. Jani Gravel.  Since she was admitted she had a CT of the chest is highly suggestive of lung cancer.  I discussed this at length with her at bedside yesterday and she agrees to go ahead with fiberoptic bronchoscopy with probable biopsies.  Other methods of diagnosis seem less likely to provide a definitive diagnosis and we explored other options.  She does agree that bronchoscopy is best at this point.  We had extensive succussion of complications.  Including bleeding pneumothorax and potentially although very rarely death  Reexam done.  No changes.  She is still very thin still still somewhat dyspneic still has diminished breath sounds on the left.  Assessment: Abnormal chest CT suggesting bronchogenic carcinoma  Plan: For fiberoptic bronchoscopy

## 2018-04-29 NOTE — Care Management (Addendum)
Juliann P[facility] of [employer] following patient, patient request that DME be delivered to room prior to DC and not shipped to house.   ADDENDUM: MD anticipated DC tomorrow 4/25. Juliann Pulse will deliver equipment today.

## 2018-04-29 NOTE — Progress Notes (Signed)
Pts blood pressure is 75/50. Paged md. md placed one time order for NS with 20 of potassium to run at 569ml/hr. Will monitor patient throughout shift.

## 2018-04-30 MED ORDER — PRO-STAT SUGAR FREE PO LIQD: 30.0000 mL | Freq: Two times a day (BID) | ORAL | 0 refills | Status: AC

## 2018-04-30 MED ORDER — GUAIFENESIN ER 600 MG PO TB12: 600.0000 mg | ORAL_TABLET | Freq: Two times a day (BID) | ORAL | 0 refills | Status: AC

## 2018-04-30 MED ORDER — ADULT MULTIVITAMIN W/MINERALS CH: 1.0000 | ORAL_TABLET | Freq: Every day | ORAL | 0 refills | Status: AC

## 2018-04-30 MED ORDER — LOPERAMIDE HCL 2 MG PO CAPS: 2.0000 mg | ORAL_CAPSULE | ORAL | 0 refills | Status: AC | PRN

## 2018-04-30 MED ORDER — BENZONATATE 100 MG PO CAPS: 100.0000 mg | ORAL_CAPSULE | Freq: Three times a day (TID) | ORAL | 0 refills | Status: AC | PRN

## 2018-04-30 MED ORDER — ENSURE ENLIVE PO LIQD: 237.0000 mL | Freq: Two times a day (BID) | ORAL | 12 refills | Status: AC

## 2018-04-30 MED ORDER — HYDROCODONE-HOMATROPINE 5-1.5 MG/5ML PO SYRP: 5.0000 mL | ORAL_SOLUTION | Freq: Four times a day (QID) | ORAL | 0 refills | Status: AC | PRN

## 2018-04-30 NOTE — Discharge Summary (Addendum)
Physician Discharge Summary  Jennifer Stevens NUU:[id number] DOB: [birth year] DOA: 04/25/2018  PCP: Christain Sacramento, MD  Admit date: 04/25/2018  Discharge date: 04/30/2018  Admitted From:Home  Disposition:  Home  Recommendations for Outpatient Follow-up:  1. Follow up with PCP in 1-2 weeks 2. Continue on antitussives as prescribed 3. Follow-up with Dr. Luan Pulling in 1 week to review biopsy results  Home Health: Yes with PT  Equipment/Devices: Rolling walker and 3 and 1  Discharge Condition: Stable  CODE STATUS: Full  Diet recommendation: Heart Healthy  Brief/Interim Summary: Per HPI: 63 year old female who presented with dyspnea. She does have significant past medical history for COPD, tobacco abuse, PTSD, anxiety and chronic back pain. She reported 4 weeks of persistent dyspnea with productive cough andassociated with precordial chest pain. She was treated as an outpatient with levofloxacin for 7 days with no improvement in her symptoms. On her initial physical examination she was afebrile, heart rate 78, respiratory rate24, blood pressure 123/82, oxygen saturation was 86% on room air,her lungs had decreased breath sounds at left base, positive ralesbut no wheezing, heart S1-S2 present and rhythmic, abdomen was soft nontender, no lower extremity edema. Her chest x-ray showed a left lower lobe opacity, CT chest showed a 3.4 x 3.0 cm left hilar mass with postobstructive atelectasis/pneumonia within the lingula and left lower lobe. 1.4 cm collection of air within the left lower lobe consolidation, concerning for small lung abscess. Trace pleural effusion on the left. EKG had 90 bpm, normal axis, normal intervals, no significant ST segment or T wave changes  Patient was admitted to the hospital working diagnosis of acute hypoxic respiratory failure due to left lower lobe/lingular pneumonia.  She continues to have persistent symptoms this morning along with some diarrhea  overnightthat is improving. Appreciate Pulmonology input with bronchoscopy and biopsy performed on 4/24.  Please see full procedural note for full details.  It does appear that patient has lung cancer and biopsy results will be reviewed in the outpatient setting with pulmonology.  Patient is depressed regarding her condition but is otherwise stable for discharge at this point in time.  She has completed her course of antibiotics with Rocephin and azithromycin with no further need at this point.  She will be set up with home health services and equipment as needed.  Discharge Diagnoses:  Principal Problem:   Pneumonia Active Problems:   PTSD (post-traumatic stress disorder)   Anxiety   Tobacco use disorder   Elevated troponin   Hypokalemia   Severe protein-calorie malnutrition (HCC)   Pressure injury of skin    Discharge Instructions  Discharge Instructions    Diet - low sodium heart healthy   Complete by:  As directed    Increase activity slowly   Complete by:  As directed      Allergies as of 04/30/2018      Reactions   Nsaids    "stomach problems"   Statins    Stomach ache      Medication List    STOP taking these medications   dexamethasone 4 MG tablet Commonly known as:  DECADRON   levofloxacin 500 MG tablet Commonly known as:  LEVAQUIN     TAKE these medications   Advair HFA 115-21 MCG/ACT inhaler Generic drug:  fluticasone-salmeterol Inhale 2 puffs into the lungs 2 (two) times a day.   albuterol 108 (90 Base) MCG/ACT inhaler Commonly known as:  VENTOLIN HFA Inhale 1-2 puffs into the lungs every 6 (six) hours as needed for  wheezing or shortness of breath.   ALPRAZolam 1 MG tablet Commonly known as:  XANAX Take 0.5-1 mg by mouth 4 (four) times daily.   benzonatate 100 MG capsule Commonly known as:  TESSALON Take 1 capsule (100 mg total) by mouth 3 (three) times daily as needed for cough.   feeding supplement (ENSURE ENLIVE) Liqd Take 237 mLs by mouth 2  (two) times daily between meals.   feeding supplement (PRO-STAT SUGAR FREE 64) Liqd Take 30 mLs by mouth 2 (two) times daily.   FLUoxetine 20 MG capsule Commonly known as:  PROZAC Take 20 mg by mouth every morning.   gabapentin 300 MG capsule Commonly known as:  NEURONTIN Take 300 mg by mouth 3 (three) times daily.   guaiFENesin 600 MG 12 hr tablet Commonly known as:  MUCINEX Take 1 tablet (600 mg total) by mouth 2 (two) times daily for 10 days.   HYDROcodone-acetaminophen 10-325 MG tablet Commonly known as:  NORCO Take 0.5-1 tablets by mouth 5 (five) times daily.   HYDROcodone-homatropine 5-1.5 MG/5ML syrup Commonly known as:  HYCODAN Take 5 mLs by mouth every 6 (six) hours as needed for up to 10 days for cough.   loperamide 2 MG capsule Commonly known as:  IMODIUM Take 1 capsule (2 mg total) by mouth as needed for diarrhea or loose stools.   multivitamin with minerals Tabs tablet Take 1 tablet by mouth daily for 30 days.   ondansetron 4 MG disintegrating tablet Commonly known as:  ZOFRAN-ODT Take 4 mg by mouth every 8 (eight) hours as needed for nausea or vomiting.   QUEtiapine 300 MG tablet Commonly known as:  SEROQUEL Take 300 mg by mouth 2 (two) times daily.   tiZANidine 4 MG tablet Commonly known as:  ZANAFLEX Take 4 mg by mouth every 6 (six) hours as needed for muscle spasms.   traZODone 150 MG tablet Commonly known as:  DESYREL Take 300 mg by mouth at bedtime.            Durable Medical Equipment  (From admission, onward)         Start     Ordered   04/29/18 1512  For home use only DME Walker rolling  Once    Question:  Patient needs a walker to treat with the following condition  Answer:  Weakness   04/29/18 1511   04/29/18 1512  For home use only DME 3 n 1  Once     04/29/18 1511   04/27/18 1058  For home use only DME Walker rolling  Once    Question:  Patient needs a walker to treat with the following condition  Answer:  Weakness   04/27/18  1057   04/27/18 1057  For home use only DME Bedside commode  Once    Question:  Patient needs a bedside commode to treat with the following condition  Answer:  Weakness   04/27/18 1057         Follow-up Information    Christain Sacramento, MD Follow up in 2 week(s).   Specialty:  Family Medicine Contact information: [place] [address] [phone]        Sinda Du, MD Follow up in 1 week(s).   Specialty:  Pulmonary Disease Contact information: [address] [place] [address] [phone]          Allergies  Allergen Reactions  . Nsaids     "stomach problems"  . Statins     Stomach  ache    Consultations:  Pulmonology-Dr. Luan Pulling   Procedures/Studies: Ct Chest W Contrast  Result Date: 04/25/2018 CLINICAL DATA:  Shortness of breath and productive cough for the past 4 weeks. Left-sided pneumonia on chest x-ray. Heavy smoker. EXAM: CT CHEST WITH CONTRAST TECHNIQUE: Multidetector CT imaging of the chest was performed during intravenous contrast administration. CONTRAST:  43mL OMNIPAQUE IOHEXOL 300 MG/ML  SOLN COMPARISON:  Chest x-ray from same day. FINDINGS: Cardiovascular: Normal heart size. No pericardial effusion. No thoracic aortic aneurysm or dissection. Coronary, aortic arch, and branch vessel atherosclerotic vascular disease. No pulmonary embolism. Mediastinum/Nodes: No enlarged mediastinal, hilar, or axillary lymph nodes. Thyroid gland, trachea, and esophagus demonstrate no significant findings. Lungs/Pleura: Moderate upper lobe predominant centrilobular emphysema. Ill-defined left hilar mass measuring approximately 3.4 x 3.0 cm with obstruction of the lingular and left lower lobe bronchi. There is volume loss and patchy, nonenhancing consolidation with peribronchovascular opacities in the lingula and left lower lobe. There is a 1.3 x 1.4 cm collection of air within the left lower lobe consolidation. There is left lower lobe  bronchiectasis. Trace left pleural effusion. No pneumothorax. Upper Abdomen: No acute abnormality.  Multiple hepatic simple cysts. Musculoskeletal: Acute appearing nondisplaced fractures of the left anterior ninth through eleventh ribs. Subacute fractures of the left fifth through ninth ribs and right sixth and seventh ribs. Age-indeterminate mild T8 superior endplate compression fracture. Schmorl's nodes involving the T7 and T9 superior endplates. IMPRESSION: 1. Ill-defined 3.4 x 3.0 cm left hilar mass concerning for primary lung cancer, likely amenable to transbronchial biopsy. 2. Postobstructive atelectasis and pneumonia within the lingula and left lower lobe. Small 1.4 cm collection of air within the left lower lobe consolidation is concerning for small lung abscess. 3. Trace left pleural effusion. 4. Acute appearing nondisplaced fractures of the left anterior ninth through eleventh ribs. Subacute fractures of the left fifth through ninth and right sixth and seventh ribs. 5. Age-indeterminate mild T8 superior endplate compression fracture. Correlate with point tenderness. 6.  Emphysema (ICD10-J43.9). 7.  Aortic atherosclerosis (ICD10-I70.0). Electronically Signed   By: Titus Dubin M.D.   On: 04/25/2018 22:07   Dg Chest Portable 1 View  Result Date: 04/25/2018 CLINICAL DATA:  Dyspnea EXAM: PORTABLE CHEST 1 VIEW COMPARISON:  11/13/2015 FINDINGS: Extensive infiltrate in the left lower lobe has developed since the prior study. Volume loss with shift of the mediastinum to the left. Possible small left effusion Right lung is clear.  Negative for heart failure. IMPRESSION: Extensive consolidation and volume loss left lower lobe. Suspect pneumonia Electronically Signed   By: Franchot Gallo M.D.   On: 04/25/2018 18:45     Discharge Exam: Vitals:   04/29/18 2140 04/30/18 0520  BP: (!) 97/59 99/60  Pulse: 75 85  Resp: 16 16  Temp: 98.3 F (36.8 C) 99 F (37.2 C)  SpO2: 96% 94%   Vitals:   04/29/18  1405 04/29/18 1952 04/29/18 2140 04/30/18 0520  BP: (!) 76/51  (!) 97/59 99/60  Pulse: 72 82 75 85  Resp: 19 18 16 16   Temp: 98.5 F (36.9 C)  98.3 F (36.8 C) 99 F (37.2 C)  TempSrc:   Oral Oral  SpO2: 93% 94% 96% 94%  Weight:      Height:        General: Pt is alert, awake, not in acute distress Cardiovascular: RRR, S1/S2 +, no rubs, no gallops Respiratory: CTA bilaterally, no wheezing, no rhonchi Abdominal: Soft, NT, ND, bowel sounds + Extremities: no edema, no cyanosis  The results of significant diagnostics from this hospitalization (including imaging, microbiology, ancillary and laboratory) are listed below for reference.     Microbiology: Recent Results (from the past 240 hour(s))  SARS Coronavirus 2 [facility] order, Performed in [facility] hospital lab)     Status: None   Collection Time: 04/25/18  5:58 PM  Result Value Ref Range Status   SARS Coronavirus 2 NEGATIVE NEGATIVE Final    Comment: (NOTE) If result is NEGATIVE SARS-CoV-2 target nucleic acids are NOT DETECTED. The SARS-CoV-2 RNA is generally detectable in upper and lower  respiratory specimens during the acute phase of infection. The lowest  concentration of SARS-CoV-2 viral copies this assay can detect is 250  copies / mL. A negative result does not preclude SARS-CoV-2 infection  and should not be used as the sole basis for treatment or other  patient management decisions.  A negative result may occur with  improper specimen collection / handling, submission of specimen other  than nasopharyngeal swab, presence of viral mutation(s) within the  areas targeted by this assay, and inadequate number of viral copies  (<250 copies / mL). A negative result must be combined with clinical  observations, patient history, and epidemiological information. If result is POSITIVE SARS-CoV-2 target nucleic acids are DETECTED. The SARS-CoV-2 RNA is generally detectable in upper and lower  respiratory specimens  dur ing the acute phase of infection.  Positive  results are indicative of active infection with SARS-CoV-2.  Clinical  correlation with patient history and other diagnostic information is  necessary to determine patient infection status.  Positive results do  not rule out bacterial infection or co-infection with other viruses. If result is PRESUMPTIVE POSTIVE SARS-CoV-2 nucleic acids MAY BE PRESENT.   A presumptive positive result was obtained on the submitted specimen  and confirmed on repeat testing.  While 2019 novel coronavirus  (SARS-CoV-2) nucleic acids may be present in the submitted sample  additional confirmatory testing may be necessary for epidemiological  and / or clinical management purposes  to differentiate between  SARS-CoV-2 and other Sarbecovirus currently known to infect humans.  If clinically indicated additional testing with an alternate test  methodology [phone]) is advised. The SARS-CoV-2 RNA is generally  detectable in upper and lower respiratory sp ecimens during the acute  phase of infection. The expected result is Negative. Fact Sheet for Patients:  StrictlyIdeas.no Fact Sheet for Healthcare Providers: BankingDealers.co.za This test is not yet approved or cleared by the [place] FDA and has been authorized for detection and/or diagnosis of SARS-CoV-2 by FDA under an Emergency Use Authorization (EUA).  This EUA will remain in effect (meaning this test can be used) for the duration of the COVID-19 declaration under Section 564(b)(1) of the Act, 21 U.S.C. section 360bbb-3(b)(1), unless the authorization is terminated or revoked sooner. Performed at [facility], [address] [place], [place] [address]   Surgical pcr screen     Status: None   Collection Time: 04/28/18  1:54 PM  Result Value Ref Range Status   MRSA, PCR NEGATIVE NEGATIVE Final   Staphylococcus aureus NEGATIVE NEGATIVE Final    Comment:  (NOTE) The Xpert SA Assay (FDA approved for NASAL specimens in patients 64 years of age and older), is one component of a comprehensive surveillance program. It is not intended to diagnose infection nor to guide or monitor treatment. Performed at [facility] [address] [place], [place] [address]      Labs: BNP (last 3 results) No results for  input(s): BNP in the last 8760 hours. Basic Metabolic Panel: Recent Labs  Lab 04/25/18 1836 04/26/18 0442 04/27/18 0509 04/28/18 0516 04/29/18 0627  NA 131* 132* 133* 132* 133*  K 2.4* 3.8 3.5 2.8* 3.8  CL 93* 99 104 103 105  CO2 26 24 24 22 22   GLUCOSE 105* 122* 86 95 120*  BUN 7* 8 9 7* 7*  CREATININE 0.47 0.48 0.47 0.46 0.42*  CALCIUM 7.8* 7.7* 7.5* 7.0* 7.1*  MG 1.9  --   --  1.6* 1.8   Liver Function Tests: Recent Labs  Lab 04/25/18 1836 04/26/18 0442  AST 15 13*  ALT 11 9  ALKPHOS 99 87  BILITOT 0.3 0.2*  PROT 6.4* 5.9*  ALBUMIN 2.4* 2.3*   No results for input(s): LIPASE, AMYLASE in the last 168 hours. No results for input(s): AMMONIA in the last 168 hours. CBC: Recent Labs  Lab 04/25/18 1836 04/26/18 0442 04/27/18 0509 04/28/18 0516 04/29/18 0627  WBC 12.0* 6.8 7.6 9.4 7.8  NEUTROABS 9.7*  --  6.3  --   --   HGB 9.9* 9.0* 8.0* 7.9* 8.2*  HCT 31.1* 29.4*  25.9* 27.3* 25.3* 26.5*  MCV 82.5 83.5 88.9 85.2 86.9  PLT 453* 474* 385 384 405*   Cardiac Enzymes: Recent Labs  Lab 04/25/18 1836 04/25/18 2227 04/26/18 0442 04/26/18 1012  TROPONINI 0.06* 0.04* 0.03* 0.03*   BNP: Invalid input(s): POCBNP CBG: No results for input(s): GLUCAP in the last 168 hours. D-Dimer No results for input(s): DDIMER in the last 72 hours. Hgb A1c No results for input(s): HGBA1C in the last 72 hours. Lipid Profile No results for input(s): CHOL, HDL, LDLCALC, TRIG, CHOLHDL, LDLDIRECT in the last 72 hours. Thyroid function studies No results for input(s): TSH, T4TOTAL, T3FREE, THYROIDAB in the last 72  hours.  Invalid input(s): FREET3 Anemia work up No results for input(s): VITAMINB12, FOLATE, FERRITIN, TIBC, IRON, RETICCTPCT in the last 72 hours. Urinalysis    Component Value Date/Time   COLORURINE YELLOW 04/25/2018 1748   APPEARANCEUR HAZY (A) 04/25/2018 1748   LABSPEC 1.009 04/25/2018 1748   PHURINE 8.0 04/25/2018 [place] 04/25/2018 1748   HGBUR NEGATIVE 04/25/2018 1748   BILIRUBINUR NEGATIVE 04/25/2018 1748   KETONESUR NEGATIVE 04/25/2018 1748   PROTEINUR NEGATIVE 04/25/2018 1748   NITRITE NEGATIVE 04/25/2018 1748   LEUKOCYTESUR NEGATIVE 04/25/2018 1748   Sepsis Labs Invalid input(s): PROCALCITONIN,  WBC,  LACTICIDVEN Microbiology Recent Results (from the past 240 hour(s))  SARS Coronavirus 2 [facility] order, Performed in [facility] hospital lab)     Status: None   Collection Time: 04/25/18  5:58 PM  Result Value Ref Range Status   SARS Coronavirus 2 NEGATIVE NEGATIVE Final    Comment: (NOTE) If result is NEGATIVE SARS-CoV-2 target nucleic acids are NOT DETECTED. The SARS-CoV-2 RNA is generally detectable in upper and lower  respiratory specimens during the acute phase of infection. The lowest  concentration of SARS-CoV-2 viral copies this assay can detect is 250  copies / mL. A negative result does not preclude SARS-CoV-2 infection  and should not be used as the sole basis for treatment or other  patient management decisions.  A negative result may occur with  improper specimen collection / handling, submission of specimen other  than nasopharyngeal swab, presence of viral mutation(s) within the  areas targeted by this assay, and inadequate number of viral copies  (<250 copies / mL). A negative result must be combined with clinical  observations,  patient history, and epidemiological information. If result is POSITIVE SARS-CoV-2 target nucleic acids are DETECTED. The SARS-CoV-2 RNA is generally detectable in upper and lower  respiratory specimens  dur ing the acute phase of infection.  Positive  results are indicative of active infection with SARS-CoV-2.  Clinical  correlation with patient history and other diagnostic information is  necessary to determine patient infection status.  Positive results do  not rule out bacterial infection or co-infection with other viruses. If result is PRESUMPTIVE POSTIVE SARS-CoV-2 nucleic acids MAY BE PRESENT.   A presumptive positive result was obtained on the submitted specimen  and confirmed on repeat testing.  While 2019 novel coronavirus  (SARS-CoV-2) nucleic acids may be present in the submitted sample  additional confirmatory testing may be necessary for epidemiological  and / or clinical management purposes  to differentiate between  SARS-CoV-2 and other Sarbecovirus currently known to infect humans.  If clinically indicated additional testing with an alternate test  methodology [phone]) is advised. The SARS-CoV-2 RNA is generally  detectable in upper and lower respiratory sp ecimens during the acute  phase of infection. The expected result is Negative. Fact Sheet for Patients:  StrictlyIdeas.no Fact Sheet for Healthcare Providers: BankingDealers.co.za This test is not yet approved or cleared by the [place] FDA and has been authorized for detection and/or diagnosis of SARS-CoV-2 by FDA under an Emergency Use Authorization (EUA).  This EUA will remain in effect (meaning this test can be used) for the duration of the COVID-19 declaration under Section 564(b)(1) of the Act, 21 U.S.C. section 360bbb-3(b)(1), unless the authorization is terminated or revoked sooner. Performed at [facility], [address], [place], [place] [address]   Surgical pcr screen     Status: None   Collection Time: 04/28/18  1:54 PM  Result Value Ref Range Status   MRSA, PCR NEGATIVE NEGATIVE Final   Staphylococcus aureus NEGATIVE NEGATIVE Final    Comment:  (NOTE) The Xpert SA Assay (FDA approved for NASAL specimens in patients 48 years of age and older), is one component of a comprehensive surveillance program. It is not intended to diagnose infection nor to guide or monitor treatment. Performed at [facility], [address], [place], [place] [address]      Time coordinating discharge: 35 minutes  SIGNED:   Rodena Goldmann, DO [facility] 04/30/2018, 9:52 AM   If 7PM-7AM, please contact night-coverage [email or web address] Password [email or web address]

## 2018-04-30 NOTE — Progress Notes (Signed)
Discussed the probable diagnosis with her.  I told her I will call her when the diagnosis on her biopsy is available

## 2018-04-30 NOTE — Progress Notes (Signed)
PT DISCHARGED, IV REMOVED, ANGIO INTACT, SITE CD&I, NO S&S OF INFECTION NOTED, VS STABLE, DENIES C/O PAIN, PT LEFT FLOOR VIA WHEELCHAIR STABLE WITH BELONGINGS  ACCOMPANIED BY NURSING STAFF. PT WAS MET BY DAUGHTER AT SHORT STAY ENTRANCE FOR TRANSPORTATION HOME.

## 2018-05-02 ENCOUNTER — Encounter (HOSPITAL_COMMUNITY): Payer: Self-pay | Admitting: Pulmonary Disease

## 2018-05-11 ENCOUNTER — Ambulatory Visit (HOSPITAL_COMMUNITY): Payer: Medicare Other | Admitting: Hematology

## 2018-06-06 DEATH — deceased

## 2020-05-20 IMAGING — CT CT CHEST WITH CONTRAST
2 of 3 series · 15 of 36 positions shown, 18 images · IV contrast (omnipaque)
Comparison: Chest x-ray from same day.

CLINICAL DATA: Shortness of breath and productive cough for the
past 4 weeks. Left-sided pneumonia on chest x-ray. Heavy smoker.

EXAM:
CT CHEST WITH CONTRAST
TECHNIQUE: Multidetector CT imaging of the chest was performed during
intravenous contrast administration.
CONTRAST:  75mL OMNIPAQUE IOHEXOL 300 MG/ML  SOLN

[Series 2: axial st · axial · 0.60mm/px · z∈[+954,+1238]mm · 12 of 168 slices shown, 15 images]
[im 13/168  mediastinal]
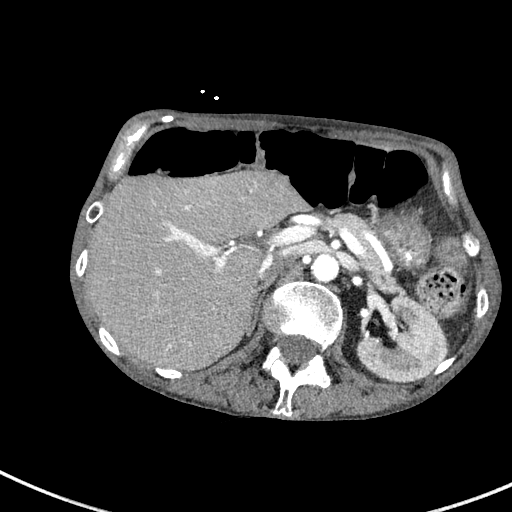
[im 13/168  lung]
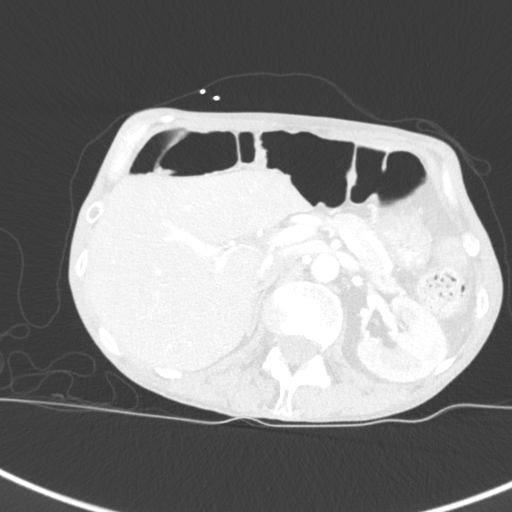
[im 25/168  lung]
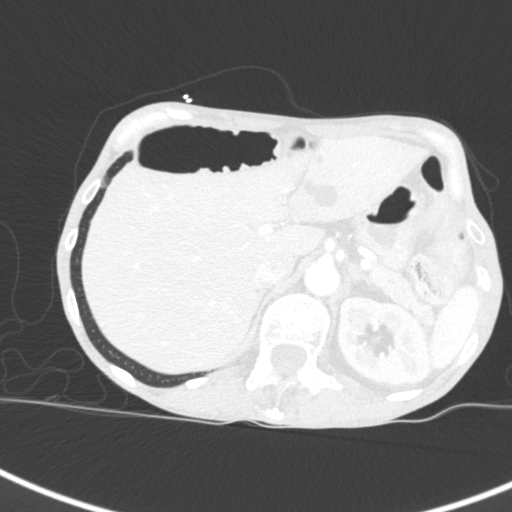
[im 38/168  lung]
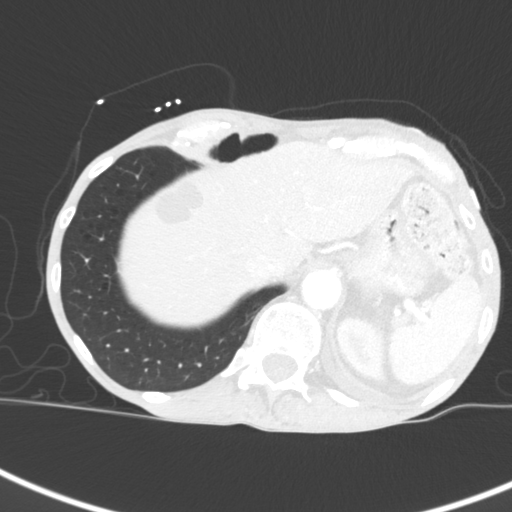
[im 50/168  lung]
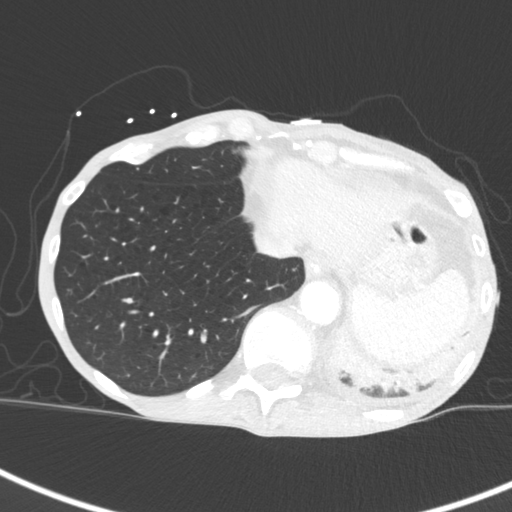
[im 62/168  mediastinal]
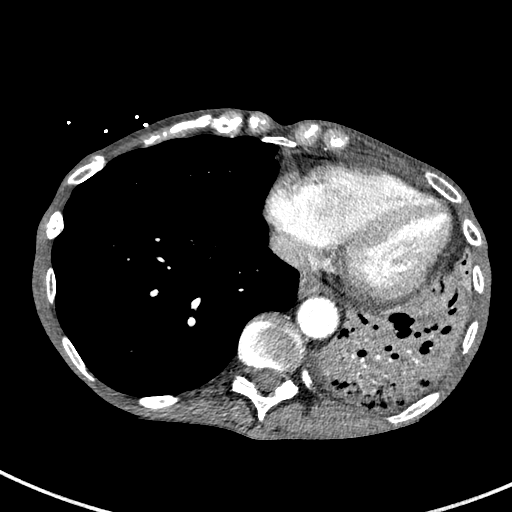
[im 62/168  lung]
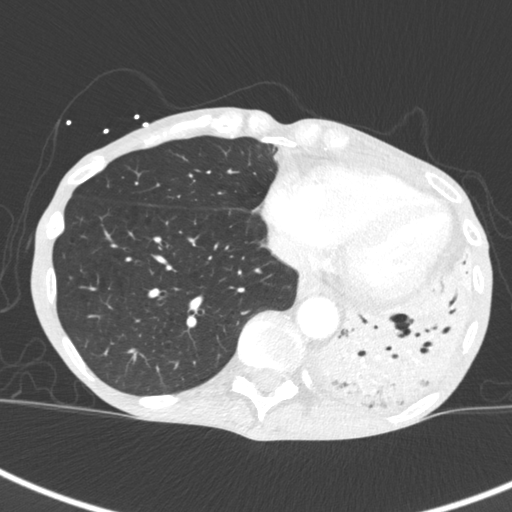
[im 75/168  lung]
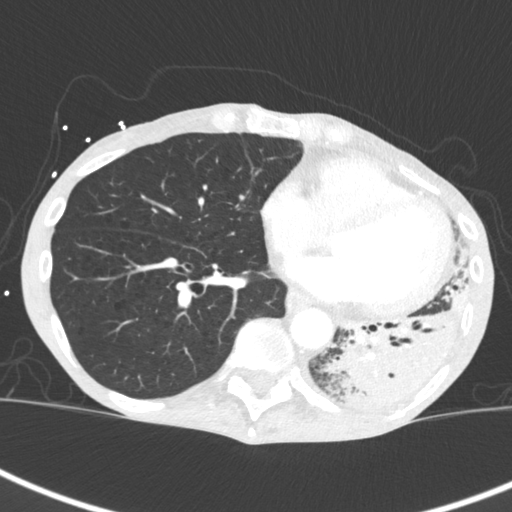
[im 93/168  lung]
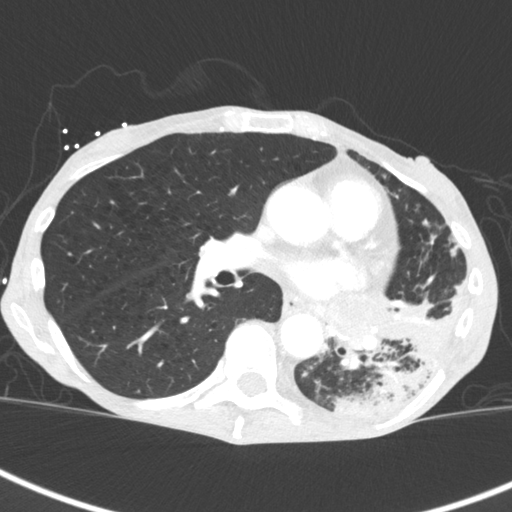
[im 106/168  lung]
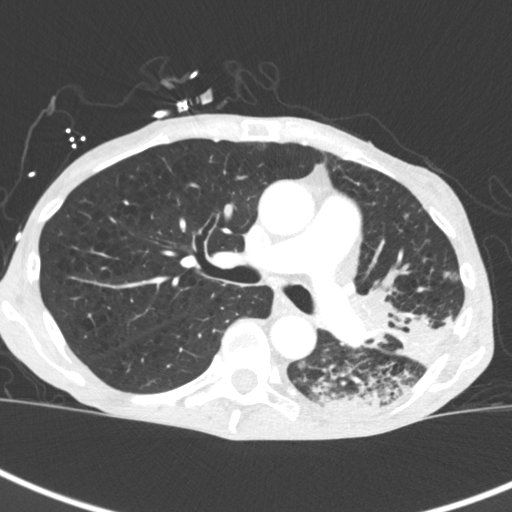
[im 118/168  mediastinal]
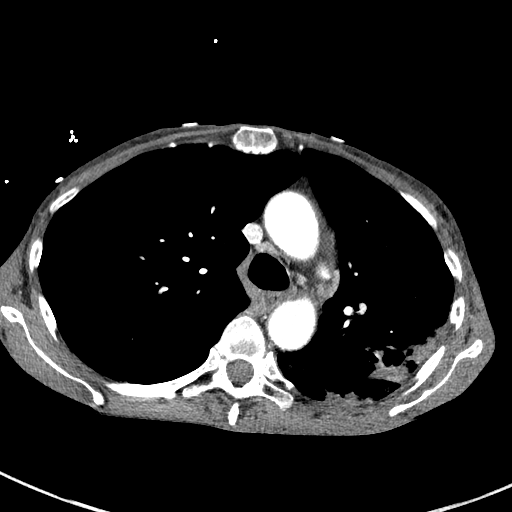
[im 118/168  lung]
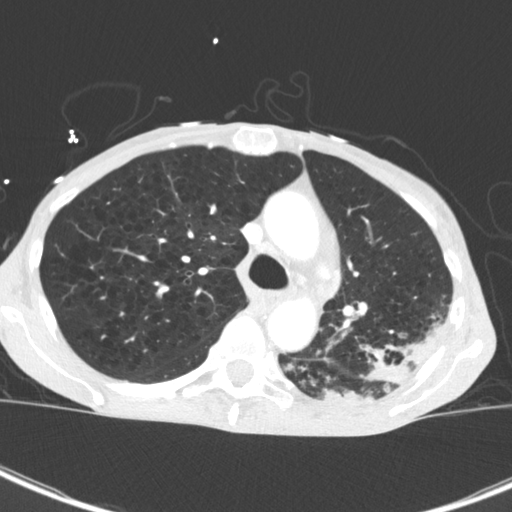
[im 130/168  lung]
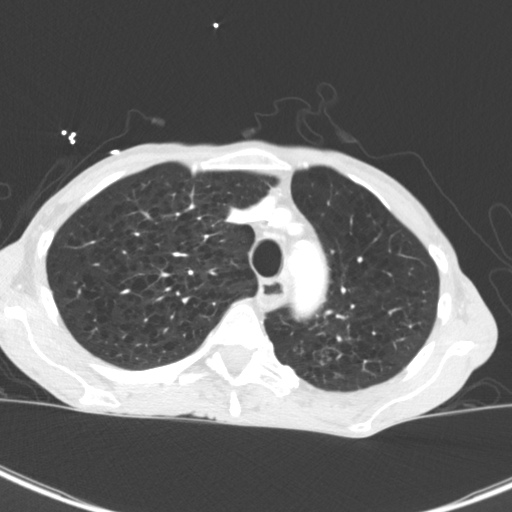
[im 143/168  lung]
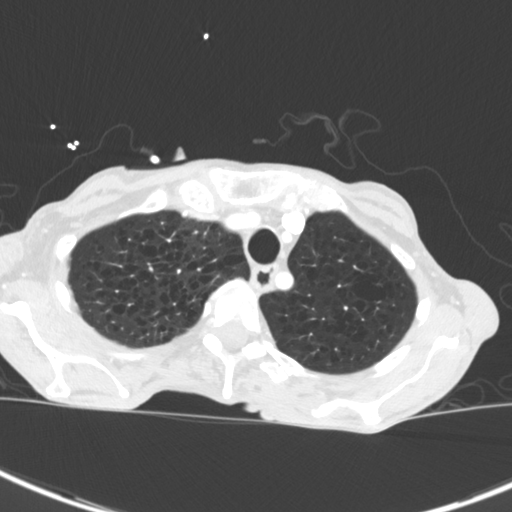
[im 155/168  lung]
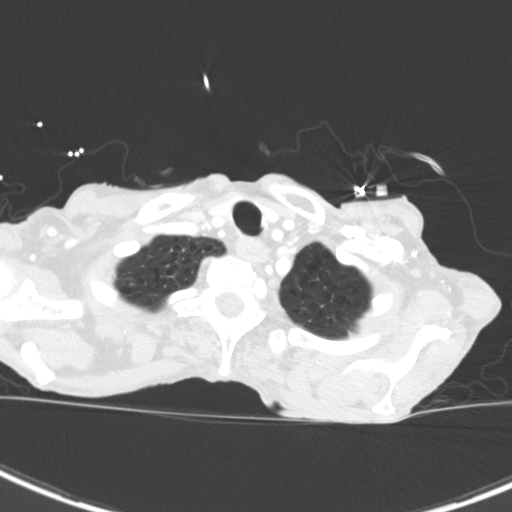

[Series 5: coronal · coronal · 0.67mm/px · 3 of 107 slices shown]
[im 22/107  lung]
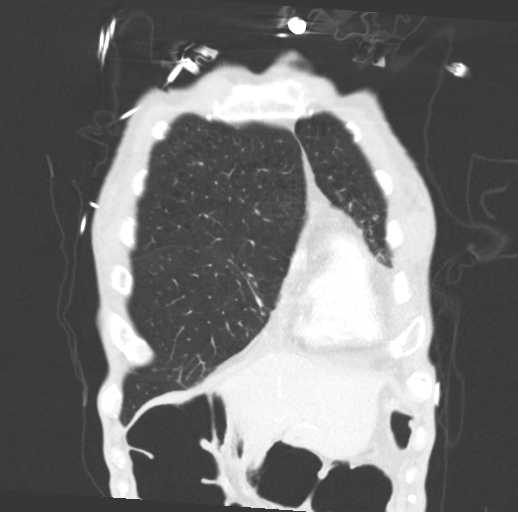
[im 43/107  lung]
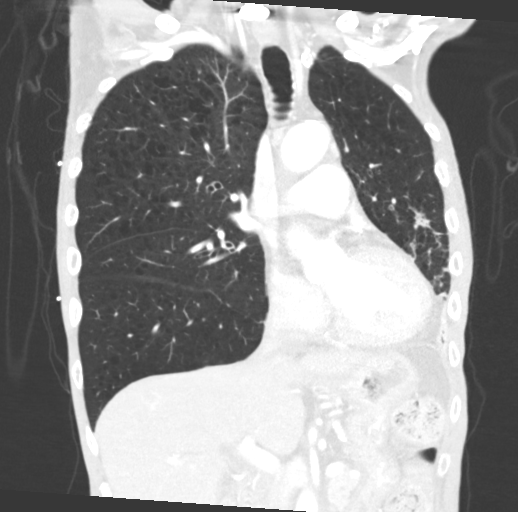
[im 64/107  lung]
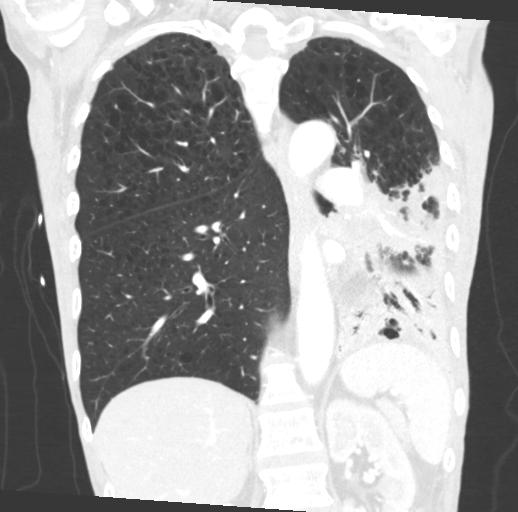

[15 of 36 positions shown; findings below may reference images not displayed]

FINDINGS: Cardiovascular: Normal heart size. No pericardial effusion. No
thoracic aortic aneurysm or dissection. Coronary, aortic arch, and
branch vessel atherosclerotic vascular disease. No pulmonary
embolism.

Mediastinum/Nodes: No enlarged mediastinal, hilar, or axillary lymph
nodes. Thyroid gland, trachea, and esophagus demonstrate no
significant findings.

Lungs/Pleura: Moderate upper lobe predominant centrilobular
emphysema. Ill-defined left hilar mass measuring approximately 3.4 x
3.0 cm with obstruction of the lingular and left lower lobe bronchi.
There is volume loss and patchy, nonenhancing consolidation with
peribronchovascular opacities in the lingula and left lower lobe.
There is a 1.3 x 1.4 cm collection of air within the left lower lobe
consolidation. There is left lower lobe bronchiectasis. Trace left
pleural effusion. No pneumothorax.

Upper Abdomen: No acute abnormality.  Multiple hepatic simple cysts.

Musculoskeletal: Acute appearing nondisplaced fractures of the left
anterior ninth through eleventh ribs. Subacute fractures of the left
fifth through ninth ribs and right sixth and seventh ribs.
Age-indeterminate mild T8 superior endplate compression fracture.
Schmorl's nodes involving the T7 and T9 superior endplates.
IMPRESSION: 1. Ill-defined 3.4 x 3.0 cm left hilar mass concerning for primary
lung cancer, likely amenable to transbronchial biopsy.
2. Postobstructive atelectasis and pneumonia within the lingula and
left lower lobe. Small 1.4 cm collection of air within the left
lower lobe consolidation is concerning for small lung abscess.
3. Trace left pleural effusion.
4. Acute appearing nondisplaced fractures of the left anterior ninth
through eleventh ribs. Subacute fractures of the left fifth through
ninth and right sixth and seventh ribs.
5. Age-indeterminate mild T8 superior endplate compression fracture.
Correlate with point tenderness.
6.  Emphysema ([id number]).
7.  Aortic atherosclerosis ([id number]).

## 2020-05-20 IMAGING — CR PORTABLE CHEST - 1 VIEW
1 series · 1 of 1 positions shown · non-contrast
Comparison: 11/13/2015

CLINICAL DATA: Dyspnea

EXAM:
PORTABLE CHEST 1 VIEW

[portable]
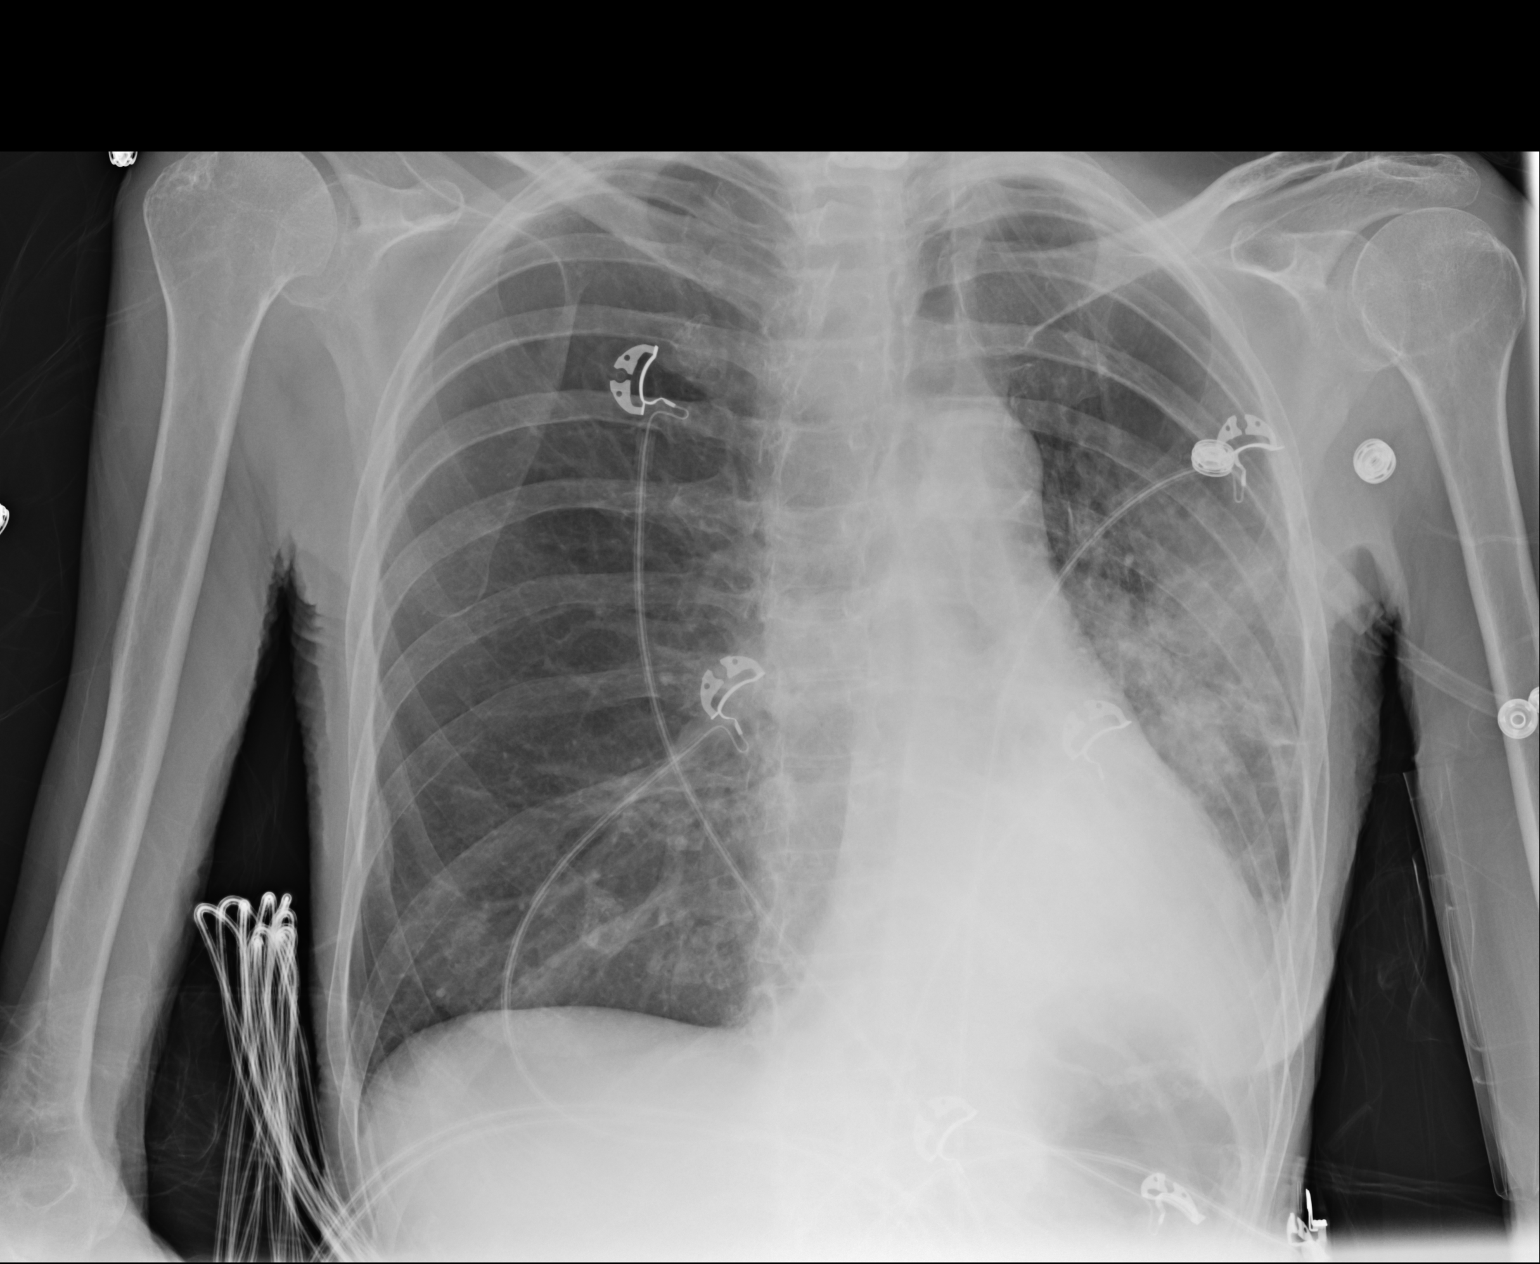

[1 of 1 positions shown; findings below may reference images not displayed]

FINDINGS: Extensive infiltrate in the left lower lobe has developed since the
prior study. Volume loss with shift of the mediastinum to the left.
Possible small left effusion

Right lung is clear.  Negative for heart failure.
IMPRESSION: Extensive consolidation and volume loss left lower lobe. Suspect
pneumonia
